# Patient Record
Sex: Female | Born: 1950 | Race: White | Hispanic: No | Smoking: Former smoker
Health system: Southern US, Community
[De-identification: ages and names within clinical notes are randomized; demographics above are authoritative.]

## PROBLEM LIST (undated history)

## (undated) DIAGNOSIS — H3320 Serous retinal detachment, unspecified eye: Secondary | ICD-10-CM

## (undated) DIAGNOSIS — D219 Benign neoplasm of connective and other soft tissue, unspecified: Secondary | ICD-10-CM

## (undated) DIAGNOSIS — W19XXXA Unspecified fall, initial encounter: Secondary | ICD-10-CM

## (undated) DIAGNOSIS — E079 Disorder of thyroid, unspecified: Secondary | ICD-10-CM

## (undated) DIAGNOSIS — I639 Cerebral infarction, unspecified: Secondary | ICD-10-CM

## (undated) DIAGNOSIS — I10 Essential (primary) hypertension: Secondary | ICD-10-CM

## (undated) DIAGNOSIS — H5440 Blindness, one eye, unspecified eye: Secondary | ICD-10-CM

## (undated) DIAGNOSIS — N289 Disorder of kidney and ureter, unspecified: Secondary | ICD-10-CM

## (undated) DIAGNOSIS — E7800 Pure hypercholesterolemia, unspecified: Secondary | ICD-10-CM

## (undated) HISTORY — DX: Benign neoplasm of connective and other soft tissue, unspecified: D21.9

## (undated) HISTORY — DX: Disorder of thyroid, unspecified: E07.9

## (undated) HISTORY — PX: EYE SURGERY: SHX253

## (undated) HISTORY — DX: Disorder of kidney and ureter, unspecified: N28.9

## (undated) HISTORY — DX: Essential (primary) hypertension: I10

## (undated) HISTORY — PX: FOOT SURGERY: SHX648

## (undated) HISTORY — PX: APPENDECTOMY: SHX54

## (undated) HISTORY — PX: CHOLECYSTECTOMY: SHX55

## (undated) HISTORY — DX: Unspecified fall, initial encounter: W19.XXXA

## (undated) HISTORY — DX: Serous retinal detachment, unspecified eye: H33.20

## (undated) HISTORY — DX: Cerebral infarction, unspecified: I63.9

## (undated) HISTORY — DX: Blindness, one eye, unspecified eye: H54.40

## (undated) HISTORY — DX: Pure hypercholesterolemia, unspecified: E78.00

## (undated) HISTORY — PX: BACK SURGERY: SHX140

## (undated) HISTORY — PX: OTHER SURGICAL HISTORY: SHX169

---

## 1997-07-20 ENCOUNTER — Encounter: Admission: RE | Admit: 1997-07-20 | Discharge: 1997-10-18 | Payer: Self-pay | Admitting: Internal Medicine

## 1998-01-28 ENCOUNTER — Encounter: Payer: Self-pay | Admitting: Emergency Medicine

## 1998-01-28 ENCOUNTER — Emergency Department (HOSPITAL_COMMUNITY): Admission: EM | Admit: 1998-01-28 | Discharge: 1998-01-28 | Payer: Self-pay | Admitting: Emergency Medicine

## 1999-03-27 ENCOUNTER — Ambulatory Visit (HOSPITAL_COMMUNITY): Admission: RE | Admit: 1999-03-27 | Discharge: 1999-03-27 | Payer: Self-pay | Admitting: Internal Medicine

## 1999-03-27 ENCOUNTER — Encounter: Payer: Self-pay | Admitting: Internal Medicine

## 1999-07-02 ENCOUNTER — Encounter: Admission: RE | Admit: 1999-07-02 | Discharge: 1999-09-30 | Payer: Self-pay | Admitting: *Deleted

## 1999-11-12 ENCOUNTER — Encounter: Payer: Self-pay | Admitting: Emergency Medicine

## 1999-11-12 ENCOUNTER — Inpatient Hospital Stay (HOSPITAL_COMMUNITY): Admission: EM | Admit: 1999-11-12 | Discharge: 1999-11-15 | Payer: Self-pay | Admitting: Emergency Medicine

## 1999-11-15 ENCOUNTER — Encounter: Payer: Self-pay | Admitting: Internal Medicine

## 1999-12-05 ENCOUNTER — Encounter: Admission: RE | Admit: 1999-12-05 | Discharge: 1999-12-31 | Payer: Self-pay | Admitting: Internal Medicine

## 1999-12-05 ENCOUNTER — Encounter: Admission: RE | Admit: 1999-12-05 | Discharge: 2000-03-04 | Payer: Self-pay | Admitting: *Deleted

## 2000-03-13 ENCOUNTER — Encounter: Admission: RE | Admit: 2000-03-13 | Discharge: 2000-06-11 | Payer: Self-pay | Admitting: *Deleted

## 2000-07-01 ENCOUNTER — Encounter: Admission: RE | Admit: 2000-07-01 | Discharge: 2000-09-29 | Payer: Self-pay | Admitting: *Deleted

## 2001-02-12 ENCOUNTER — Encounter: Payer: Self-pay | Admitting: Ophthalmology

## 2001-02-13 ENCOUNTER — Ambulatory Visit (HOSPITAL_COMMUNITY): Admission: RE | Admit: 2001-02-13 | Discharge: 2001-02-14 | Payer: Self-pay | Admitting: Ophthalmology

## 2001-08-06 ENCOUNTER — Other Ambulatory Visit: Admission: RE | Admit: 2001-08-06 | Discharge: 2001-08-06 | Payer: Self-pay | Admitting: Obstetrics and Gynecology

## 2002-08-11 ENCOUNTER — Other Ambulatory Visit: Admission: RE | Admit: 2002-08-11 | Discharge: 2002-08-11 | Payer: Self-pay | Admitting: Obstetrics and Gynecology

## 2003-01-05 ENCOUNTER — Encounter: Admission: RE | Admit: 2003-01-05 | Discharge: 2003-01-05 | Payer: Self-pay | Admitting: Obstetrics and Gynecology

## 2003-08-18 ENCOUNTER — Other Ambulatory Visit: Admission: RE | Admit: 2003-08-18 | Discharge: 2003-08-18 | Payer: Self-pay | Admitting: Obstetrics and Gynecology

## 2003-11-10 ENCOUNTER — Emergency Department (HOSPITAL_COMMUNITY): Admission: EM | Admit: 2003-11-10 | Discharge: 2003-11-10 | Payer: Self-pay | Admitting: Emergency Medicine

## 2004-01-13 ENCOUNTER — Ambulatory Visit: Payer: Self-pay | Admitting: Internal Medicine

## 2004-04-17 ENCOUNTER — Ambulatory Visit: Payer: Self-pay | Admitting: Internal Medicine

## 2004-06-06 ENCOUNTER — Ambulatory Visit: Payer: Self-pay | Admitting: Internal Medicine

## 2004-08-20 ENCOUNTER — Encounter: Admission: RE | Admit: 2004-08-20 | Discharge: 2004-08-20 | Payer: Self-pay | Admitting: Obstetrics and Gynecology

## 2004-09-19 ENCOUNTER — Other Ambulatory Visit: Admission: RE | Admit: 2004-09-19 | Discharge: 2004-09-19 | Payer: Self-pay | Admitting: Obstetrics and Gynecology

## 2004-10-25 ENCOUNTER — Ambulatory Visit: Payer: Self-pay | Admitting: Internal Medicine

## 2004-12-11 ENCOUNTER — Ambulatory Visit: Payer: Self-pay | Admitting: Internal Medicine

## 2005-06-04 ENCOUNTER — Ambulatory Visit: Payer: Self-pay | Admitting: Internal Medicine

## 2005-06-19 ENCOUNTER — Inpatient Hospital Stay (HOSPITAL_COMMUNITY): Admission: AD | Admit: 2005-06-19 | Discharge: 2005-06-21 | Payer: Self-pay | Admitting: Orthopedic Surgery

## 2005-06-22 ENCOUNTER — Observation Stay (HOSPITAL_COMMUNITY): Admission: EM | Admit: 2005-06-22 | Discharge: 2005-06-24 | Payer: Self-pay | Admitting: Emergency Medicine

## 2005-09-11 ENCOUNTER — Encounter: Payer: Self-pay | Admitting: Internal Medicine

## 2005-10-01 ENCOUNTER — Encounter: Admission: RE | Admit: 2005-10-01 | Discharge: 2005-10-01 | Payer: Self-pay | Admitting: Obstetrics and Gynecology

## 2005-10-10 ENCOUNTER — Other Ambulatory Visit: Admission: RE | Admit: 2005-10-10 | Discharge: 2005-10-10 | Payer: Self-pay | Admitting: Obstetrics and Gynecology

## 2005-10-10 ENCOUNTER — Ambulatory Visit: Payer: Self-pay | Admitting: Internal Medicine

## 2005-11-15 ENCOUNTER — Ambulatory Visit: Payer: Self-pay | Admitting: Internal Medicine

## 2005-11-28 ENCOUNTER — Ambulatory Visit: Payer: Self-pay | Admitting: Internal Medicine

## 2005-12-05 ENCOUNTER — Ambulatory Visit: Payer: Self-pay | Admitting: Gastroenterology

## 2006-01-08 ENCOUNTER — Ambulatory Visit: Payer: Self-pay | Admitting: Gastroenterology

## 2006-09-29 ENCOUNTER — Encounter: Payer: Self-pay | Admitting: Internal Medicine

## 2006-09-29 DIAGNOSIS — I2510 Atherosclerotic heart disease of native coronary artery without angina pectoris: Secondary | ICD-10-CM | POA: Insufficient documentation

## 2006-09-29 DIAGNOSIS — E119 Type 2 diabetes mellitus without complications: Secondary | ICD-10-CM

## 2006-09-29 DIAGNOSIS — E785 Hyperlipidemia, unspecified: Secondary | ICD-10-CM

## 2006-09-29 DIAGNOSIS — Z8679 Personal history of other diseases of the circulatory system: Secondary | ICD-10-CM | POA: Insufficient documentation

## 2006-09-29 DIAGNOSIS — I10 Essential (primary) hypertension: Secondary | ICD-10-CM | POA: Insufficient documentation

## 2006-09-29 DIAGNOSIS — F329 Major depressive disorder, single episode, unspecified: Secondary | ICD-10-CM

## 2006-11-12 ENCOUNTER — Ambulatory Visit: Payer: Self-pay | Admitting: Internal Medicine

## 2006-11-13 ENCOUNTER — Encounter: Admission: RE | Admit: 2006-11-13 | Discharge: 2006-11-13 | Payer: Self-pay | Admitting: Internal Medicine

## 2006-11-15 ENCOUNTER — Ambulatory Visit: Payer: Self-pay | Admitting: Internal Medicine

## 2006-12-25 ENCOUNTER — Encounter: Payer: Self-pay | Admitting: Internal Medicine

## 2007-02-11 ENCOUNTER — Encounter: Payer: Self-pay | Admitting: Internal Medicine

## 2007-02-18 ENCOUNTER — Telehealth: Payer: Self-pay | Admitting: Internal Medicine

## 2007-02-27 ENCOUNTER — Telehealth: Payer: Self-pay | Admitting: Internal Medicine

## 2007-03-20 ENCOUNTER — Telehealth: Payer: Self-pay | Admitting: Internal Medicine

## 2007-04-16 ENCOUNTER — Telehealth: Payer: Self-pay | Admitting: Internal Medicine

## 2007-04-17 ENCOUNTER — Ambulatory Visit: Payer: Self-pay | Admitting: Internal Medicine

## 2007-04-17 LAB — CONVERTED CEMR LAB
Crystals: NEGATIVE
Hemoglobin, Urine: NEGATIVE
Ketones, ur: NEGATIVE mg/dL
Leukocytes, UA: NEGATIVE
Nitrite: NEGATIVE
Specific Gravity, Urine: >=1.03 (ref 1.000–1.03)
Total Protein, Urine: 30 mg/dL — AB
WBC, UA: NONE SEEN cells/hpf
pH: 5.5 (ref 5.0–8.0)

## 2007-04-24 ENCOUNTER — Encounter: Payer: Self-pay | Admitting: Internal Medicine

## 2007-04-29 ENCOUNTER — Ambulatory Visit: Payer: Self-pay | Admitting: Internal Medicine

## 2007-04-29 DIAGNOSIS — D239 Other benign neoplasm of skin, unspecified: Secondary | ICD-10-CM | POA: Insufficient documentation

## 2007-04-30 ENCOUNTER — Encounter: Payer: Self-pay | Admitting: Internal Medicine

## 2007-05-02 ENCOUNTER — Encounter: Payer: Self-pay | Admitting: Internal Medicine

## 2007-05-04 ENCOUNTER — Encounter: Payer: Self-pay | Admitting: Internal Medicine

## 2007-05-11 ENCOUNTER — Telehealth: Payer: Self-pay | Admitting: Internal Medicine

## 2007-05-15 ENCOUNTER — Encounter: Payer: Self-pay | Admitting: Internal Medicine

## 2007-05-19 ENCOUNTER — Telehealth: Payer: Self-pay | Admitting: Internal Medicine

## 2007-07-31 ENCOUNTER — Ambulatory Visit: Payer: Self-pay | Admitting: Internal Medicine

## 2007-07-31 LAB — CONVERTED CEMR LAB
BUN: 12 mg/dL (ref 6–23)
CO2: 32 meq/L (ref 19–32)
Calcium: 9.6 mg/dL (ref 8.4–10.5)
Chloride: 105 meq/L (ref 96–112)
Cholesterol: 152 mg/dL (ref 0–200)
Creatinine, Ser: 1 mg/dL (ref 0.4–1.2)
GFR calc Af Amer: 73 mL/min
GFR calc non Af Amer: 60 mL/min
Glucose, Bld: 100 mg/dL — ABNORMAL HIGH (ref 70–99)
HDL: 44.4 mg/dL (ref >39.0)
Hgb A1c MFr Bld: 7.4 % — ABNORMAL HIGH (ref 4.6–6.0)
LDL Cholesterol: 91 mg/dL (ref 0–99)
Potassium: 4.3 meq/L (ref 3.5–5.1)
Sodium: 144 meq/L (ref 135–145)
Total CHOL/HDL Ratio: 3.4
Triglycerides: 82 mg/dL (ref 0–149)
VLDL: 16 mg/dL (ref 0–40)

## 2007-08-04 ENCOUNTER — Encounter: Payer: Self-pay | Admitting: Internal Medicine

## 2007-08-05 ENCOUNTER — Encounter: Payer: Self-pay | Admitting: Internal Medicine

## 2007-08-21 ENCOUNTER — Encounter: Payer: Self-pay | Admitting: Internal Medicine

## 2007-08-27 ENCOUNTER — Encounter: Payer: Self-pay | Admitting: Internal Medicine

## 2007-09-10 ENCOUNTER — Encounter: Payer: Self-pay | Admitting: Internal Medicine

## 2007-10-15 ENCOUNTER — Telehealth: Payer: Self-pay | Admitting: Internal Medicine

## 2007-12-03 ENCOUNTER — Encounter: Admission: RE | Admit: 2007-12-03 | Discharge: 2007-12-03 | Payer: Self-pay | Admitting: Internal Medicine

## 2007-12-03 ENCOUNTER — Ambulatory Visit: Payer: Self-pay | Admitting: Internal Medicine

## 2007-12-07 ENCOUNTER — Encounter: Payer: Self-pay | Admitting: Obstetrics and Gynecology

## 2007-12-07 ENCOUNTER — Other Ambulatory Visit: Admission: RE | Admit: 2007-12-07 | Discharge: 2007-12-07 | Payer: Self-pay | Admitting: Obstetrics and Gynecology

## 2007-12-07 ENCOUNTER — Ambulatory Visit: Payer: Self-pay | Admitting: Obstetrics and Gynecology

## 2008-02-25 ENCOUNTER — Telehealth: Payer: Self-pay | Admitting: Internal Medicine

## 2008-04-22 ENCOUNTER — Telehealth: Payer: Self-pay | Admitting: Internal Medicine

## 2008-04-27 ENCOUNTER — Ambulatory Visit: Payer: Self-pay | Admitting: Internal Medicine

## 2008-04-27 DIAGNOSIS — M25519 Pain in unspecified shoulder: Secondary | ICD-10-CM

## 2008-04-27 LAB — CONVERTED CEMR LAB
ALT: 20 units/L (ref 0–35)
AST: 23 units/L (ref 0–37)
Albumin: 3.6 g/dL (ref 3.5–5.2)
Alkaline Phosphatase: 70 units/L (ref 39–117)
Basophils Absolute: 0.1 10*3/uL (ref 0.0–0.1)
Calcium: 10.3 mg/dL (ref 8.4–10.5)
Creatinine, Ser: 1 mg/dL (ref 0.4–1.2)
Creatinine,U: 189.6 mg/dL
Eosinophils Absolute: 0.1 10*3/uL (ref 0.0–0.7)
Eosinophils Relative: 2 % (ref 0.0–5.0)
Glucose, Bld: 144 mg/dL — ABNORMAL HIGH (ref 70–99)
HDL: 46 mg/dL (ref >39.00)
Hemoglobin: 14.6 g/dL (ref 12.0–15.0)
LDL Cholesterol: 95 mg/dL (ref 0–99)
Leukocytes, UA: NEGATIVE
MCHC: 35.3 g/dL (ref 30.0–36.0)
MCV: 90.6 fL (ref 78.0–100.0)
Microalb, Ur: 4.5 mg/dL — ABNORMAL HIGH (ref 0.0–1.9)
Neutrophils Relative %: 76.9 % (ref 43.0–77.0)
Platelets: 190 10*3/uL (ref 150.0–400.0)
RBC: 4.57 M/uL (ref 3.87–5.11)
RDW: 12.7 % (ref 11.5–14.6)
TSH: 2.76 microintl units/mL (ref 0.35–5.50)
Total CHOL/HDL Ratio: 4
Triglycerides: 108 mg/dL (ref 0.0–149.0)
Urobilinogen, UA: 0.2 (ref 0.0–1.0)
WBC: 7.5 10*3/uL (ref 4.5–10.5)
pH: 5.5 (ref 5.0–8.0)

## 2008-04-28 ENCOUNTER — Encounter: Payer: Self-pay | Admitting: Internal Medicine

## 2008-04-28 ENCOUNTER — Telehealth: Payer: Self-pay | Admitting: Internal Medicine

## 2008-05-05 ENCOUNTER — Telehealth: Payer: Self-pay | Admitting: Internal Medicine

## 2008-05-11 ENCOUNTER — Ambulatory Visit: Payer: Self-pay | Admitting: Internal Medicine

## 2008-05-23 ENCOUNTER — Encounter: Payer: Self-pay | Admitting: Internal Medicine

## 2008-06-24 ENCOUNTER — Telehealth: Payer: Self-pay | Admitting: Internal Medicine

## 2008-07-06 ENCOUNTER — Encounter: Payer: Self-pay | Admitting: Internal Medicine

## 2008-07-15 ENCOUNTER — Encounter: Payer: Self-pay | Admitting: Internal Medicine

## 2008-07-28 ENCOUNTER — Ambulatory Visit: Payer: Self-pay | Admitting: Internal Medicine

## 2008-07-28 DIAGNOSIS — H3320 Serous retinal detachment, unspecified eye: Secondary | ICD-10-CM | POA: Insufficient documentation

## 2008-08-03 ENCOUNTER — Telehealth: Payer: Self-pay | Admitting: Internal Medicine

## 2008-08-29 ENCOUNTER — Telehealth: Payer: Self-pay | Admitting: Internal Medicine

## 2008-09-16 ENCOUNTER — Encounter: Payer: Self-pay | Admitting: Internal Medicine

## 2008-10-18 ENCOUNTER — Telehealth (INDEPENDENT_AMBULATORY_CARE_PROVIDER_SITE_OTHER): Payer: Self-pay | Admitting: *Deleted

## 2008-10-21 ENCOUNTER — Ambulatory Visit: Payer: Self-pay | Admitting: Internal Medicine

## 2008-10-21 ENCOUNTER — Telehealth (INDEPENDENT_AMBULATORY_CARE_PROVIDER_SITE_OTHER): Payer: Self-pay | Admitting: *Deleted

## 2008-10-21 DIAGNOSIS — R112 Nausea with vomiting, unspecified: Secondary | ICD-10-CM | POA: Insufficient documentation

## 2008-10-21 LAB — CONVERTED CEMR LAB
AST: 25 units/L (ref 0–37)
Basophils Relative: 0.4 % (ref 0.0–3.0)
CO2: 28 meq/L (ref 19–32)
Chloride: 103 meq/L (ref 96–112)
Creatinine, Ser: 4.3 mg/dL — ABNORMAL HIGH (ref 0.4–1.2)
GFR calc non Af Amer: 11.14 mL/min (ref >60)
Glucose, Bld: 219 mg/dL — ABNORMAL HIGH (ref 70–99)
Hemoglobin, Urine: NEGATIVE
Hemoglobin: 15 g/dL (ref 12.0–15.0)
Ketones, ur: NEGATIVE mg/dL
Lymphocytes Relative: 20.7 % (ref 12.0–46.0)
Lymphs Abs: 1.8 10*3/uL (ref 0.7–4.0)
MCHC: 34.2 g/dL (ref 30.0–36.0)
MCV: 94.4 fL (ref 78.0–100.0)
Monocytes Absolute: 0.4 10*3/uL (ref 0.1–1.0)
Neutrophils Relative %: 72.8 % (ref 43.0–77.0)
Nitrite: NEGATIVE
Potassium: 4.5 meq/L (ref 3.5–5.1)
RBC: 4.63 M/uL (ref 3.87–5.11)
RDW: 12.2 % (ref 11.5–14.6)
Sodium: 140 meq/L (ref 135–145)
Total Protein: 7.4 g/dL (ref 6.0–8.3)
Urobilinogen, UA: 0.2 (ref 0.0–1.0)
pH: 5 (ref 5.0–8.0)

## 2008-10-24 ENCOUNTER — Ambulatory Visit: Payer: Self-pay | Admitting: Internal Medicine

## 2008-10-24 ENCOUNTER — Inpatient Hospital Stay (HOSPITAL_COMMUNITY): Admission: EM | Admit: 2008-10-24 | Discharge: 2008-10-26 | Payer: Self-pay | Admitting: Emergency Medicine

## 2008-10-26 ENCOUNTER — Telehealth: Payer: Self-pay | Admitting: Internal Medicine

## 2008-10-29 ENCOUNTER — Telehealth (INDEPENDENT_AMBULATORY_CARE_PROVIDER_SITE_OTHER): Payer: Self-pay | Admitting: *Deleted

## 2008-11-01 ENCOUNTER — Telehealth: Payer: Self-pay | Admitting: Internal Medicine

## 2008-11-09 ENCOUNTER — Ambulatory Visit: Payer: Self-pay | Admitting: Internal Medicine

## 2008-11-30 ENCOUNTER — Encounter: Payer: Self-pay | Admitting: Internal Medicine

## 2008-12-01 ENCOUNTER — Encounter: Payer: Self-pay | Admitting: Internal Medicine

## 2008-12-07 ENCOUNTER — Ambulatory Visit: Payer: Self-pay | Admitting: Obstetrics and Gynecology

## 2008-12-07 ENCOUNTER — Encounter: Payer: Self-pay | Admitting: Obstetrics and Gynecology

## 2008-12-07 ENCOUNTER — Other Ambulatory Visit: Admission: RE | Admit: 2008-12-07 | Discharge: 2008-12-07 | Payer: Self-pay | Admitting: Obstetrics and Gynecology

## 2008-12-12 ENCOUNTER — Ambulatory Visit: Payer: Self-pay | Admitting: Obstetrics and Gynecology

## 2009-01-12 ENCOUNTER — Ambulatory Visit: Payer: Self-pay | Admitting: Internal Medicine

## 2009-01-12 DIAGNOSIS — R630 Anorexia: Secondary | ICD-10-CM

## 2009-01-12 DIAGNOSIS — R279 Unspecified lack of coordination: Secondary | ICD-10-CM

## 2009-01-12 DIAGNOSIS — I951 Orthostatic hypotension: Secondary | ICD-10-CM

## 2009-01-13 ENCOUNTER — Telehealth: Payer: Self-pay | Admitting: Internal Medicine

## 2009-01-16 ENCOUNTER — Ambulatory Visit (HOSPITAL_COMMUNITY): Admission: RE | Admit: 2009-01-16 | Discharge: 2009-01-16 | Payer: Self-pay | Admitting: Internal Medicine

## 2009-01-19 ENCOUNTER — Telehealth: Payer: Self-pay | Admitting: Internal Medicine

## 2009-01-20 ENCOUNTER — Encounter: Admission: RE | Admit: 2009-01-20 | Discharge: 2009-02-08 | Payer: Self-pay | Admitting: Internal Medicine

## 2009-01-30 ENCOUNTER — Telehealth: Payer: Self-pay | Admitting: Internal Medicine

## 2009-02-10 ENCOUNTER — Encounter: Payer: Self-pay | Admitting: Internal Medicine

## 2009-02-22 ENCOUNTER — Encounter: Admission: RE | Admit: 2009-02-22 | Discharge: 2009-03-21 | Payer: Self-pay | Admitting: Internal Medicine

## 2009-02-27 ENCOUNTER — Ambulatory Visit: Payer: Self-pay | Admitting: Internal Medicine

## 2009-03-02 ENCOUNTER — Telehealth: Payer: Self-pay | Admitting: Internal Medicine

## 2009-03-06 ENCOUNTER — Telehealth: Payer: Self-pay | Admitting: Internal Medicine

## 2009-03-09 ENCOUNTER — Telehealth: Payer: Self-pay | Admitting: Internal Medicine

## 2009-03-16 ENCOUNTER — Telehealth: Payer: Self-pay | Admitting: Internal Medicine

## 2009-03-16 ENCOUNTER — Encounter: Payer: Self-pay | Admitting: Internal Medicine

## 2009-04-10 ENCOUNTER — Telehealth: Payer: Self-pay | Admitting: Internal Medicine

## 2009-04-12 ENCOUNTER — Inpatient Hospital Stay (HOSPITAL_COMMUNITY): Admission: EM | Admit: 2009-04-12 | Discharge: 2009-04-15 | Payer: Self-pay | Admitting: Emergency Medicine

## 2009-04-12 ENCOUNTER — Encounter (INDEPENDENT_AMBULATORY_CARE_PROVIDER_SITE_OTHER): Payer: Self-pay | Admitting: *Deleted

## 2009-04-12 ENCOUNTER — Ambulatory Visit: Payer: Self-pay | Admitting: Internal Medicine

## 2009-04-12 DIAGNOSIS — N184 Chronic kidney disease, stage 4 (severe): Secondary | ICD-10-CM

## 2009-04-12 LAB — CONVERTED CEMR LAB
Basophils Absolute: 0 10*3/uL (ref 0.0–0.1)
Basophils Relative: 0.4 % (ref 0.0–3.0)
CO2: 34 meq/L — ABNORMAL HIGH (ref 19–32)
Calcium: 10.3 mg/dL (ref 8.4–10.5)
Chloride: 101 meq/L (ref 96–112)
Creatinine, Ser: 3.9 mg/dL — ABNORMAL HIGH (ref 0.4–1.2)
Eosinophils Relative: 1.7 % (ref 0.0–5.0)
GFR calc non Af Amer: 12.45 mL/min (ref >60)
HCT: 35.9 % — ABNORMAL LOW (ref 36.0–46.0)
Hemoglobin: 12.1 g/dL (ref 12.0–15.0)
Hgb A1c MFr Bld: 5.4 % (ref 4.6–6.5)
Monocytes Absolute: 0.5 10*3/uL (ref 0.1–1.0)
Neutro Abs: 6.8 10*3/uL (ref 1.4–7.7)
Potassium: 3.7 meq/L (ref 3.5–5.1)
WBC: 9.2 10*3/uL (ref 4.5–10.5)

## 2009-04-24 ENCOUNTER — Telehealth: Payer: Self-pay | Admitting: Internal Medicine

## 2009-04-27 ENCOUNTER — Ambulatory Visit: Payer: Self-pay | Admitting: Internal Medicine

## 2009-04-27 LAB — CONVERTED CEMR LAB
BUN: 23 mg/dL (ref 6–23)
Chloride: 103 meq/L (ref 96–112)
Creatinine, Ser: 4.6 mg/dL (ref 0.4–1.2)
Glucose, Bld: 102 mg/dL — ABNORMAL HIGH (ref 70–99)
Potassium: 3.9 meq/L (ref 3.5–5.1)
Sodium: 143 meq/L (ref 135–145)

## 2009-04-28 ENCOUNTER — Telehealth: Payer: Self-pay | Admitting: Internal Medicine

## 2009-04-28 ENCOUNTER — Encounter: Payer: Self-pay | Admitting: Internal Medicine

## 2009-05-02 ENCOUNTER — Encounter: Payer: Self-pay | Admitting: Internal Medicine

## 2009-05-02 LAB — CONVERTED CEMR LAB: Collection Interval-CRCL: 24 hr

## 2009-05-15 ENCOUNTER — Telehealth: Payer: Self-pay | Admitting: Internal Medicine

## 2009-05-16 ENCOUNTER — Telehealth: Payer: Self-pay | Admitting: Internal Medicine

## 2009-05-16 ENCOUNTER — Encounter: Payer: Self-pay | Admitting: Internal Medicine

## 2009-05-31 ENCOUNTER — Telehealth: Payer: Self-pay | Admitting: Internal Medicine

## 2009-06-08 ENCOUNTER — Telehealth: Payer: Self-pay | Admitting: Internal Medicine

## 2009-06-09 ENCOUNTER — Ambulatory Visit: Payer: Self-pay | Admitting: Internal Medicine

## 2009-06-25 ENCOUNTER — Encounter: Payer: Self-pay | Admitting: Internal Medicine

## 2009-07-12 ENCOUNTER — Encounter: Payer: Self-pay | Admitting: Internal Medicine

## 2009-07-18 ENCOUNTER — Encounter: Payer: Self-pay | Admitting: Internal Medicine

## 2009-08-03 ENCOUNTER — Telehealth: Payer: Self-pay | Admitting: Internal Medicine

## 2009-08-24 ENCOUNTER — Ambulatory Visit: Payer: Self-pay | Admitting: Internal Medicine

## 2009-09-04 ENCOUNTER — Telehealth: Payer: Self-pay | Admitting: Internal Medicine

## 2009-09-07 ENCOUNTER — Inpatient Hospital Stay (HOSPITAL_COMMUNITY): Admission: AD | Admit: 2009-09-07 | Discharge: 2009-09-12 | Payer: Self-pay | Admitting: Internal Medicine

## 2009-09-08 ENCOUNTER — Ambulatory Visit: Payer: Self-pay | Admitting: Internal Medicine

## 2009-09-11 ENCOUNTER — Ambulatory Visit: Payer: Self-pay | Admitting: Physical Medicine & Rehabilitation

## 2009-09-12 ENCOUNTER — Inpatient Hospital Stay (HOSPITAL_COMMUNITY)
Admission: RE | Admit: 2009-09-12 | Discharge: 2009-09-20 | Payer: Self-pay | Admitting: Physical Medicine & Rehabilitation

## 2009-09-15 ENCOUNTER — Ambulatory Visit: Payer: Self-pay | Admitting: Psychology

## 2009-09-16 ENCOUNTER — Ambulatory Visit: Payer: Self-pay | Admitting: Physical Medicine & Rehabilitation

## 2009-09-20 ENCOUNTER — Encounter: Payer: Self-pay | Admitting: Internal Medicine

## 2009-09-23 ENCOUNTER — Encounter: Payer: Self-pay | Admitting: Internal Medicine

## 2009-09-25 ENCOUNTER — Telehealth: Payer: Self-pay | Admitting: Internal Medicine

## 2009-09-29 ENCOUNTER — Ambulatory Visit: Payer: Self-pay | Admitting: Internal Medicine

## 2009-09-29 ENCOUNTER — Ambulatory Visit: Payer: Self-pay | Admitting: Cardiology

## 2009-09-29 ENCOUNTER — Inpatient Hospital Stay (HOSPITAL_COMMUNITY): Admission: EM | Admit: 2009-09-29 | Discharge: 2009-10-06 | Payer: Self-pay | Admitting: Emergency Medicine

## 2009-10-04 ENCOUNTER — Telehealth: Payer: Self-pay | Admitting: Internal Medicine

## 2009-10-04 ENCOUNTER — Encounter (INDEPENDENT_AMBULATORY_CARE_PROVIDER_SITE_OTHER): Payer: Self-pay | Admitting: Neurology

## 2009-10-05 ENCOUNTER — Ambulatory Visit: Payer: Self-pay | Admitting: Vascular Surgery

## 2009-10-05 ENCOUNTER — Encounter: Payer: Self-pay | Admitting: Internal Medicine

## 2009-10-05 ENCOUNTER — Encounter (INDEPENDENT_AMBULATORY_CARE_PROVIDER_SITE_OTHER): Payer: Self-pay | Admitting: Neurology

## 2009-10-06 ENCOUNTER — Telehealth: Payer: Self-pay | Admitting: Internal Medicine

## 2009-10-09 ENCOUNTER — Telehealth: Payer: Self-pay | Admitting: Internal Medicine

## 2009-10-11 ENCOUNTER — Telehealth: Payer: Self-pay | Admitting: Internal Medicine

## 2009-10-12 ENCOUNTER — Ambulatory Visit: Payer: Self-pay | Admitting: Internal Medicine

## 2009-10-19 ENCOUNTER — Telehealth: Payer: Self-pay | Admitting: Internal Medicine

## 2009-10-20 ENCOUNTER — Telehealth: Payer: Self-pay | Admitting: Internal Medicine

## 2009-10-23 ENCOUNTER — Telehealth: Payer: Self-pay | Admitting: Internal Medicine

## 2009-10-24 ENCOUNTER — Telehealth: Payer: Self-pay | Admitting: Internal Medicine

## 2009-10-25 ENCOUNTER — Telehealth (INDEPENDENT_AMBULATORY_CARE_PROVIDER_SITE_OTHER): Payer: Self-pay | Admitting: *Deleted

## 2009-10-30 ENCOUNTER — Ambulatory Visit: Payer: Self-pay | Admitting: Internal Medicine

## 2009-10-30 DIAGNOSIS — E538 Deficiency of other specified B group vitamins: Secondary | ICD-10-CM | POA: Insufficient documentation

## 2009-11-03 ENCOUNTER — Telehealth: Payer: Self-pay | Admitting: Internal Medicine

## 2009-11-08 ENCOUNTER — Telehealth: Payer: Self-pay | Admitting: Internal Medicine

## 2009-11-17 ENCOUNTER — Telehealth: Payer: Self-pay | Admitting: Internal Medicine

## 2009-11-20 ENCOUNTER — Encounter: Payer: Self-pay | Admitting: Internal Medicine

## 2009-11-22 ENCOUNTER — Encounter: Payer: Self-pay | Admitting: Internal Medicine

## 2009-11-30 ENCOUNTER — Ambulatory Visit: Payer: Self-pay | Admitting: Internal Medicine

## 2009-12-09 ENCOUNTER — Ambulatory Visit: Payer: Self-pay | Admitting: Internal Medicine

## 2009-12-11 ENCOUNTER — Ambulatory Visit: Payer: Self-pay | Admitting: Obstetrics and Gynecology

## 2009-12-18 ENCOUNTER — Encounter: Payer: Self-pay | Admitting: Internal Medicine

## 2010-01-02 ENCOUNTER — Ambulatory Visit: Payer: Self-pay | Admitting: Internal Medicine

## 2010-01-25 ENCOUNTER — Ambulatory Visit: Payer: Self-pay | Admitting: Internal Medicine

## 2010-01-25 ENCOUNTER — Ambulatory Visit: Payer: Self-pay | Admitting: Obstetrics and Gynecology

## 2010-02-02 ENCOUNTER — Ambulatory Visit: Payer: Self-pay | Admitting: Internal Medicine

## 2010-03-04 ENCOUNTER — Encounter: Payer: Self-pay | Admitting: Internal Medicine

## 2010-03-04 ENCOUNTER — Encounter: Payer: Self-pay | Admitting: Obstetrics and Gynecology

## 2010-03-05 ENCOUNTER — Telehealth: Payer: Self-pay | Admitting: Internal Medicine

## 2010-03-05 ENCOUNTER — Encounter: Payer: Self-pay | Admitting: Internal Medicine

## 2010-03-05 ENCOUNTER — Ambulatory Visit (INDEPENDENT_AMBULATORY_CARE_PROVIDER_SITE_OTHER)
Admission: RE | Admit: 2010-03-05 | Discharge: 2010-03-05 | Payer: Medicare Other | Source: Home / Self Care | Attending: Internal Medicine | Admitting: Internal Medicine

## 2010-03-05 DIAGNOSIS — E538 Deficiency of other specified B group vitamins: Secondary | ICD-10-CM

## 2010-03-12 ENCOUNTER — Telehealth: Payer: Self-pay | Admitting: Internal Medicine

## 2010-03-13 NOTE — Progress Notes (Signed)
Summary: CALL  Phone Note Call from Patient   Summary of Call: See previous phone note, pt was unable to complete study. Her GI dr is going to "give her medication" to help keep her from vomiting. She is req a call back from Dr Debby Bud. Would you like to see pt in office for f/u?  Initial call taken by: Lamar Sprinkles, CMA,  May 16, 2009 12:28 PM  Follow-up for Phone Call        tried calling - no anser. Did not leave msg. called-spoke with SO. She was stated on reglan  by Dr. Kathrene Bongo. Follow-up by: Jacques Navy MD,  May 16, 2009 1:13 PM

## 2010-03-13 NOTE — Progress Notes (Signed)
Summary: INSULIN  Phone Note Call from Patient   Summary of Call: Pt was d/c'd 8/10. Cbg's are running 133 to 175. Please advise. She was changed from 50 units to 30 units daily. Should he change dose?  Initial call taken by: Lamar Sprinkles, CMA,  September 25, 2009 2:57 PM  Follow-up for Phone Call        we can slowly titrate up to avoid low blood sugars: increase to 35 units, if fasting CBG is greater than 140 3 days in a row increase by 5 units, then repeat the 3 day cycle. If fasting CBG less than 100 let me know. Follow-up by: Jacques Navy MD,  September 26, 2009 8:22 AM  Additional Follow-up for Phone Call Additional follow up Details #1::        Pt's husband informed.  Additional Follow-up by: Lamar Sprinkles, CMA,  September 26, 2009 5:01 PM

## 2010-03-13 NOTE — Progress Notes (Signed)
Summary: BP UPDATE  Phone Note From Other Clinic Call back at Home Phone [phone]   Caller: [phone] [facility]  Summary of Call: Home health called for pt. FYI BP sitting 124/64 standing 110/60 positive for lightheadedness & "feeling like she was going to pass out" Initial call taken by: Lamar Sprinkles, CMA,  October 19, 2009 3:39 PM  Follow-up for Phone Call        Much better than in the past. will continue present dose of fludrocortisone. Follow-up by: Jacques Navy MD,  October 19, 2009 5:40 PM  Additional Follow-up for Phone Call Additional follow up Details #1::        Left detailed vm on pt's home # Additional Follow-up by: Lamar Sprinkles, CMA,  October 19, 2009 6:25 PM

## 2010-03-13 NOTE — Progress Notes (Signed)
  Phone Note Refill Request Message from:  Fax from Pharmacy on April 10, 2009 9:35 AM  Refills Requested: Medication #1:  SIMVASTATIN 20 MG  TABS Take 1 tablet by mouth once a day Initial call taken by: Ami Bullins CMA,  April 10, 2009 9:35 AM    Prescriptions: SIMVASTATIN 20 MG  TABS (SIMVASTATIN) Take 1 tablet by mouth once a day  #90 x 3   Entered by:   Ami Bullins CMA   Authorized by:   Jacques Navy MD   Signed by:   Bill Salinas CMA on 04/10/2009   Method used:   Faxed to ...       [facility]       [address] [place], [place]  [address]       Ph: [phone]       Fax: [phone]   RxID:   [id number]

## 2010-03-13 NOTE — Medication Information (Signed)
Summary: [facility]  [facility]   Imported By: Sherian Rein 06/28/2009 10:48:34  _____________________________________________________________________  External Attachment:    Type:   Image     Comment:   External Document

## 2010-03-13 NOTE — Progress Notes (Signed)
Summary: PT SERVICES  Phone Note Other Incoming   CallerDenny [phone] Summary of Call: Physic. Ther calling to obtain a verbal order for to extended pt's ther. 3 times a week for 4 more weeks.  Initial call taken by: Ami Bullins CMA,  November 03, 2009 1:51 PM  Follow-up for Phone Call        OK to extend physical therapy as requested.  Follow-up by: Jacques Navy MD,  November 06, 2009 8:35 AM  Additional Follow-up for Phone Call Additional follow up Details #1::        Left vm for ERIN  Additional Follow-up by: Lamar Sprinkles, CMA,  November 06, 2009 11:34 AM

## 2010-03-13 NOTE — Letter (Signed)
Summary: [facility]   Imported By: Sherian Rein 12/02/2009 10:07:24  _____________________________________________________________________  External Attachment:    Type:   Image     Comment:   External Document

## 2010-03-13 NOTE — Miscellaneous (Signed)
Summary: inpatient [name]  inpatient [name]   Imported By: Lester [place] 09/29/2009 10:26:56  _____________________________________________________________________  External Attachment:    Type:   Image     Comment:   External Document

## 2010-03-13 NOTE — Progress Notes (Signed)
Summary: REFILL - Tramadol  Phone Note From Other Clinic   Summary of Call: [facility] that was signed by Dr. It was for a refill of tramadol. This med is not in EMR. Please provide details of rx. Request was for 50mg . Need directions and qty for 3 mth supply. Thanks Initial call taken by: Lamar Sprinkles, CMA,  March 06, 2009 4:11 PM  Follow-up for Phone Call        tramadol 50mg  1 or 2 three times a day for pain (replacing darvocet); #180/mon refill x 3 Follow-up by: Jacques Navy MD,  March 06, 2009 10:36 PM  Additional Follow-up for Phone Call Additional follow up Details #1::        Mail order is req 3 mth supply, Is #540 with 0 refills ok?  Additional Follow-up by: Lamar Sprinkles, CMA,  March 07, 2009 9:39 AM    Additional Follow-up for Phone Call Additional follow up Details #2::    OK for 90 day supply Follow-up by: Jacques Navy MD,  March 07, 2009 9:55 AM  New/Updated Medications: TRAMADOL HCL 50 MG TABS (TRAMADOL HCL) 1 or 2 three times a day as needed for pain Prescriptions: TRAMADOL HCL 50 MG TABS (TRAMADOL HCL) 1 or 2 three times a day as needed for pain  #540 x 0   Entered by:   Lamar Sprinkles, CMA   Authorized by:   Jacques Navy MD   Signed by:   Lamar Sprinkles, CMA on 03/07/2009   Method used:   Faxed to ...       [facility]       [address] [place], [place]  [address]       Ph: [phone]       Fax: [phone]   RxID:   [id number]

## 2010-03-13 NOTE — Assessment & Plan Note (Signed)
Summary: b12 shot/will bring own/men/cd   Nurse Visit   Allergies: No Known Drug Allergies  Medication Administration  Injection # 1:    Medication: Vit B12 1000 mcg    Diagnosis: B12 DEFICIENCY (ICD-266.2)    Route: IM    Site: L deltoid    Exp Date: 09/2010    Lot #: [id number]    Mfr: American Regent    Patient tolerated injection without complications    Given by: Brenton Grills MA (October 30, 2009 3:13 PM)  Orders Added: 1)  Admin of patients own med IM/SQ [phone]

## 2010-03-13 NOTE — Progress Notes (Signed)
  Phone Note Refill Request      New/Updated Medications: LANTUS 100 UNIT/ML  SOLN (INSULIN GLARGINE) 30 Units every A.M. FUROSEMIDE 40 MG TABS (FUROSEMIDE) 1 tab once daily POTASSIUM CHLORIDE 20 MEQ PACK (POTASSIUM CHLORIDE) 1 once daily SERTRALINE HCL 100 MG TABS (SERTRALINE HCL) 1 tab once daily AMLODIPINE BESYLATE 2.5 MG TABS (AMLODIPINE BESYLATE) 1 tab once daily ACETAMINOPHEN 325 MG TABS (ACETAMINOPHEN) 2 tabs q 4 hours as needed BISCOLAX 10 MG SUPP (BISACODYL) 1 rectaly as needed      Current Medications (verified): 1)  Carvedilol 25 Mg  Tabs (Carvedilol) .... 1/2 By Mouth Two Times A Day 2)  Lantus 100 Unit/ml  Soln (Insulin Glargine) .... 30 Units Every A.m. 3)  Simvastatin 20 Mg  Tabs (Simvastatin) .... Take 1 Tablet By Mouth Once A Day 4)  Plavix 75 Mg  Tabs (Clopidogrel Bisulfate) .... Once Daily 5)  Aspirin 81 Mg  Tabs (Aspirin) .... As Needed 6)  Levothroid 75 Mcg Tabs (Levothyroxine Sodium) .... Take 1 Tablet By Mouth Once A Day 7)  Tramadol Hcl 50 Mg Tabs (Tramadol Hcl) .Marland Kitchen.. 1 or 2 Three Times A Day As Needed For Pain 8)  Promethazine Hcl 12.5 Mg Tabs (Promethazine Hcl) .Marland Kitchen.. 1 Tab Q 6 Hours As Needed 9)  Metoclopramide Hcl 10 Mg Tabs (Metoclopramide Hcl) .Marland Kitchen.. 1 By Mouth Three Times A Day 10)  Midodrine Hcl 10 Mg Tabs (Midodrine Hcl) .Marland Kitchen.. 1 Tab Three Times A Day 11)  Glucerna  Liqd (Nutritional Supplements) .Marland Kitchen.. 1 Can Three Times A Day 12)  Furosemide 40 Mg Tabs (Furosemide) .Marland Kitchen.. 1 Tab Once Daily 13)  Potassium Chloride 20 Meq Pack (Potassium Chloride) .Marland Kitchen.. 1 Once Daily 14)  Sertraline Hcl 100 Mg Tabs (Sertraline Hcl) .Marland Kitchen.. 1 Tab Once Daily 15)  Amlodipine Besylate 2.5 Mg Tabs (Amlodipine Besylate) .Marland Kitchen.. 1 Tab Once Daily 16)  Acetaminophen 325 Mg Tabs (Acetaminophen) .... 2 Tabs Q 4 Hours As Needed 17)  Biscolax 10 Mg Supp (Bisacodyl) .Marland Kitchen.. 1 Rectaly As Needed  Allergies (verified): No Known Drug Allergies

## 2010-03-13 NOTE — Progress Notes (Signed)
Summary: FYI  Phone Note From Other Clinic   Caller: ERIN [phone] Summary of Call: FYI Pt has missed visit today, pt declined and will continue next week.  Initial call taken by: Lamar Sprinkles, CMA,  November 17, 2009 11:50 AM

## 2010-03-13 NOTE — Progress Notes (Signed)
Summary: FYI - BP UPDATE  Phone Note From Other Clinic   Caller: Baruch Gouty - PT Summary of Call: Therapist called with BP update from today's visit. BP sitting 132/70 Standing 90/64 Initial call taken by: Lamar Sprinkles, CMA,  October 20, 2009 2:06 PM  Follow-up for Phone Call        Noted. Will not make any change in medications at this point. Follow-up by: Jacques Navy MD,  October 20, 2009 4:48 PM

## 2010-03-13 NOTE — Progress Notes (Signed)
Summary: FYI - Home Health   Phone Note From Other Clinic   Caller: [phone] Denny Peon - PT Summary of Call: Physical Therapist- Erin called. FYI - pt reported a fall yesterday after getting up w/o using her walker. No visible injuries beside small bruise on right knee.  Initial call taken by: Lamar Sprinkles, CMA,  November 08, 2009 1:53 PM  Follow-up for Phone Call        noted. Follow-up by: Jacques Navy MD,  November 08, 2009 1:58 PM

## 2010-03-13 NOTE — Miscellaneous (Signed)
Summary: [place]  [place]   Imported By: Lester [place] 03/30/2009 08:02:50  _____________________________________________________________________  External Attachment:    Type:   Image     Comment:   External Document

## 2010-03-13 NOTE — Assessment & Plan Note (Signed)
Summary: B-12 / MEN /NWS   Nurse Visit   Allergies: No Known Drug Allergies  Medication Administration  Injection # 1:    Medication: Vit B12 1000 mcg    Diagnosis: B12 DEFICIENCY (ICD-266.2)    Route: IM    Site: R deltoid    Exp Date: 09/12/2010    Lot #: [id number]    Mfr: American Regent    Patient tolerated injection without complications    Given by: Rock Nephew CMA (November 30, 2009 2:06 PM)  Orders Added: 1)  Admin of Therapeutic Inj  intramuscular or subcutaneous [96372] 2)  Vit B12 1000 mcg [J3420]   Medication Administration  Injection # 1:    Medication: Vit B12 1000 mcg    Diagnosis: B12 DEFICIENCY (ICD-266.2)    Route: IM    Site: R deltoid    Exp Date: 09/12/2010    Lot #: [id number]    Mfr: American Regent    Patient tolerated injection without complications    Given by: Rock Nephew CMA (November 30, 2009 2:06 PM)  Orders Added: 1)  Admin of Therapeutic Inj  intramuscular or subcutaneous [96372] 2)  Vit B12 1000 mcg [J3420]

## 2010-03-13 NOTE — Assessment & Plan Note (Signed)
Summary: WHEELCHAIR DISCUSSION--PER PT--STC   Vital Signs:  Patient profile:   60 year old female Height:      65 inches Weight:      224 pounds BMI:     37.41 O2 Sat:      98 % on Room air Temp:     98.2 degrees F oral Pulse rate:   64 / minute BP sitting:   136 / 70  (left arm) Cuff size:   large  Vitals Entered By: Bill Salinas CMA (June 09, 2009 2:50 PM)  O2 Flow:  Room air CC: pt here for office visit to discuss scooter, pt states she is  no longer taking asa, tramadol,metformin, and is unsure if she amlodpine/ ab   Primary Care Provider:  Jacques Navy MD  CC:  pt here for office visit to discuss scooter, pt states she is  no longer taking asa, tramadol, metformin, and and is unsure if she amlodpine/ ab.  History of Present Illness: Patient is well known to the practice and followed for HTN, DM, h/o CVA with hemiparesis/weakness. She presents today for a mobility assessment for a power assisted scooter or w/c. He is able to ambulate and walked into the office.   Current Medications (verified): 1)  Carvedilol 25 Mg  Tabs (Carvedilol) .Marland Kitchen.. 1 By Mouth Two Times A Day 2)  Lantus 100 Unit/ml  Soln (Insulin Glargine) .... 50 Units Every A.m. 3)  Simvastatin 20 Mg  Tabs (Simvastatin) .... Take 1 Tablet By Mouth Once A Day 4)  Plavix 75 Mg  Tabs (Clopidogrel Bisulfate) .... Once Daily 5)  Sertraline Hcl 100 Mg  Tabs (Sertraline Hcl) .Marland Kitchen.. 1 Once Daily 6)  Aspirin 81 Mg  Tabs (Aspirin) .... As Needed 7)  Levothroid 75 Mcg Tabs (Levothyroxine Sodium) .... Take 1 Tablet By Mouth Once A Day 8)  Tramadol Hcl 50 Mg Tabs (Tramadol Hcl) .Marland Kitchen.. 1 or 2 Three Times A Day As Needed For Pain 9)  Metformin Hcl 1000 Mg Tabs (Metformin Hcl) .Marland Kitchen.. 1 Qam and 1 Pm 10)  Trazodone Hcl 50 Mg Tabs (Trazodone Hcl) .Marland Kitchen.. 1 Qhs 11)  Amlodipine Besylate 2.5 Mg Tabs (Amlodipine Besylate) .Marland Kitchen.. 1 Tab Daily 12)  Promethazine Hcl 12.5 Mg Tabs (Promethazine Hcl) .Marland Kitchen.. 1 Tab Q 6 Hours As Needed  Allergies  (verified): No Known Drug Allergies  Past History:  Past Medical History: Last updated: 05-10-2007 Coronary artery disease (right) Depression Diabetes mellitus, type II Hyperlipidemia Hypertension Cerebrovascular accident, hx of blind right eye - DM  Past Surgical History: Last updated: 07/28/2008 Appendectomy Caesarean section Cholecystectomy Endarterectomy Lumbar Diskectomy retinal detachment repair - "gas bubble"  June '10  Family History: Last updated: 10-May-2007 father-deceased @80 : CAD, HTN mother - deceased @ 37: cancer lung, DM, HTN Neg- breast or colon cancer; no CAD/MI  Social History: Last updated: 05/10/2007 [facility] [employer] [place] -'[address] married '72 1 daughter -'62 work: Systems analyst until disabled stable home situation.  Risk Factors: Alcohol Use: 0 (04/12/2009) Caffeine Use: 0 (2007/05/10) Exercise: no (05/10/07)  Risk Factors: Smoking Status: never (04/12/2009)  Review of Systems       The patient complains of dyspnea on exertion, peripheral edema, muscle weakness, and difficulty walking.  The patient denies anorexia, weight loss, weight gain, chest pain, prolonged cough, abdominal pain, and hematochezia.    Physical Exam  General:  obese white female in no distress Head:  normocephalic and atraumatic.   Eyes:  C&S cleaar, drooping left eye, loss  of vision left eye Lungs:  normal respiratory effort and normal breath sounds.   Heart:  normal rate and regular rhythm.   Msk:  Normal ROM UE and LE extremities. Able to stand with minmal assist by pushing herself up. [place] 100' with minimal balance assist only, able to pivot. Pulses:  2+ radial pulse Neurologic:  alert & oriented X3.  slight left facial droop. Skin:  turgor normal and color normal.   Psych:  Oriented X3, memory intact for recent and remote, normally interactive, and good eye contact.     Impression & Recommendations:  Problem # 1:  ATAXIA  (ICD-781.3) Reviewed with the patient the questionaire for mobility device application and she does not qualify for a device. I also encouraged her to remain as independently mobile as possible  - this being best for her health. I have encoraged her to use a rolling walker for balance and a light weight wheel chair for prolonged walking, e.g. when out shopping.  Complete Medication List: 1)  Carvedilol 25 Mg Tabs (Carvedilol) .Marland Kitchen.. 1 by mouth two times a day 2)  Lantus 100 Unit/ml Soln (Insulin glargine) .... 50 units every a.m. 3)  Simvastatin 20 Mg Tabs (Simvastatin) .... Take 1 tablet by mouth once a day 4)  Plavix 75 Mg Tabs (Clopidogrel bisulfate) .... Once daily 5)  Sertraline Hcl 100 Mg Tabs (Sertraline hcl) .Marland Kitchen.. 1 once daily 6)  Aspirin 81 Mg Tabs (Aspirin) .... As needed 7)  Levothroid 75 Mcg Tabs (Levothyroxine sodium) .... Take 1 tablet by mouth once a day 8)  Tramadol Hcl 50 Mg Tabs (Tramadol hcl) .Marland Kitchen.. 1 or 2 three times a day as needed for pain 9)  Metformin Hcl 1000 Mg Tabs (Metformin hcl) .Marland Kitchen.. 1 qam and 1 pm 10)  Trazodone Hcl 50 Mg Tabs (Trazodone hcl) .Marland Kitchen.. 1 qhs 11)  Amlodipine Besylate 2.5 Mg Tabs (Amlodipine besylate) .Marland Kitchen.. 1 tab daily 12)  Promethazine Hcl 12.5 Mg Tabs (Promethazine hcl) .Marland Kitchen.. 1 tab q 6 hours as needed

## 2010-03-13 NOTE — Progress Notes (Signed)
Summary: refill  Phone Note Refill Request Call back at Home Phone [phone] Message from:  Patient  Refills Requested: Medication #1:  FLUDROCORTISONE ACETATE 0.1 MG TABS 1 by mouth three times a day. for orthostatic hypotension.   Dosage confirmed as above?Dosage Confirmed  Please advise if ok to send in rx for #90 ?  Initial call taken by: Rock Nephew CMA,  October 23, 2009 3:56 PM  Follow-up for Phone Call        Yes. Changed med list to fludrocortisone 0.1mg  three times a day.  Follow-up by: Jacques Navy MD,  October 23, 2009 6:35 PM    Prescriptions: FLUDROCORTISONE ACETATE 0.1 MG TABS (FLUDROCORTISONE ACETATE) 1 by mouth three times a day. for orthostatic hypotension.  #90 x 2   Entered by:   Bill Salinas CMA   Authorized by:   Jacques Navy MD   Signed by:   Bill Salinas CMA on 10/24/2009   Method used:   [id number]       [address]       [place], [place]  [address]       Ph: [phone] or [phone]       Fax: [phone]   RxID:   [id number]

## 2010-03-13 NOTE — Miscellaneous (Signed)
Summary: Initial Summary for PT/[facility]  Initial Summary for PT/[facility]   Imported By: Sherian Rein 02/15/2009 09:04:59  _____________________________________________________________________  External Attachment:    Type:   Image     Comment:   External Document

## 2010-03-13 NOTE — Progress Notes (Signed)
Summary: REFILL  Phone Note Call from Patient   Summary of Call: Patient is requesting refill of Trazodone 50mg  qhs [facility]. Med was removed from EMR but pt says she still takes, Ok to update and send in rx for supply with refill's?  Initial call taken by: Lamar Sprinkles, CMA,  April 24, 2009 2:34 PM  Follow-up for Phone Call        ok for this Follow-up by: Corwin Levins MD,  April 24, 2009 5:32 PM    New/Updated Medications: TRAZODONE HCL 50 MG TABS (TRAZODONE HCL) 1 qhs Prescriptions: TRAZODONE HCL 50 MG TABS (TRAZODONE HCL) 1 qhs  #90 x 3   Entered by:   Ami Bullins CMA   Authorized by:   Jacques Navy MD   Signed by:   Bill Salinas CMA on 04/25/2009   Method used:   Faxed to ...       [facility]       [address] [place], [place]  [address]       Ph: [phone]       Fax: [phone]   RxID:   [id number]

## 2010-03-13 NOTE — Progress Notes (Signed)
Summary: [facility]  Phone Note Call from Patient   Summary of Call: Patient is requesting rx for scooter from the scooter store. Pt will need to get forms from company and schedule ov  (face to face eval) for completion of these. office visit is requirement of insurance.  Initial call taken by: Lamar Sprinkles, CMA,  May 31, 2009 11:33 AM  Follow-up for Phone Call        Pt informed of above Follow-up by: Lamar Sprinkles, CMA,  May 31, 2009 5:30 PM

## 2010-03-13 NOTE — Progress Notes (Signed)
Summary: FYI - Home Health   Phone Note From Other Clinic   Caller: Genevieve Norlander Summary of Call: [name] called w/FYI, they set up patient today for services.  Initial call taken by: Lamar Sprinkles, CMA,  October 09, 2009 4:16 PM

## 2010-03-13 NOTE — Letter (Signed)
Summary: [facility]   Imported By: Sherian Rein 05/30/2009 09:37:41  _____________________________________________________________________  External Attachment:    Type:   Image     Comment:   External Document

## 2010-03-13 NOTE — Letter (Signed)
Summary: [facility]   Imported By: Sherian Rein 07/25/2009 14:22:16  _____________________________________________________________________  External Attachment:    Type:   Image     Comment:   External Document

## 2010-03-13 NOTE — Miscellaneous (Signed)
Summary: [facility]   Imported By: Lester Ebro 11/24/2009 09:29:56  _____________________________________________________________________  External Attachment:    Type:   Image     Comment:   External Document

## 2010-03-13 NOTE — Progress Notes (Signed)
Summary: rx clarification  Phone Note From Pharmacy   Caller: [facility] fax--[phone] Summary of Call: Rec'd fax from pharmacy that prescription for Lantus could not be filled because something was illegible on the original prescription. Please clarify directions and re-fax.  **On the fax the med is listed as Lantus via 110u/ml  10ml vial #3 [id number]                        Please advise. Initial call taken by: Lucious Groves,  March 09, 2009 11:10 AM  Follow-up for Phone Call        reviewed last office note: Rx eScribed local x 1 month and mail order. Both eScripts for lantus say 70 international units once daily, 100u/ml. there was no handwriting to misread.  This did not need to come to my desk Follow-up by: Jacques Navy MD,  March 09, 2009 1:23 PM    Prescriptions: LANTUS 100 UNIT/ML  SOLN (INSULIN GLARGINE) 70 Units every A.M.  #3 mth supply x 3   Entered by:   Lamar Sprinkles, CMA   Authorized by:   Jacques Navy MD   Signed by:   Lamar Sprinkles, CMA on 03/09/2009   Method used:   Faxed to ...       [facility]       [address] [place], [place]  [address]       Ph: [phone]       Fax: [phone]   RxID:   [id number]

## 2010-03-13 NOTE — Progress Notes (Signed)
Summary: [facility]  Phone Note From Other Clinic   Caller: [phone] Summary of Call: [facility]report. Pt has ov tomorrow. She continues to have orthostatic hypotension, lying BP was 130/70, standing 90/64. They need activity guidelines for physical therapy.  Initial call taken by: Lamar Sprinkles, CMA,  October 11, 2009 3:31 PM  Follow-up for Phone Call        PT if patient is not overly symptomatic with BP drop Follow-up by: Jacques Navy MD,  October 12, 2009 8:02 AM  Additional Follow-up for Phone Call Additional follow up Details #1::        Pt has office visit today, per Genevieve Norlander, pt is very symptomatic w/bp drop. Please eval at office visit and advise.  Additional Follow-up by: Lamar Sprinkles, CMA,  October 12, 2009 10:48 AM    Additional Follow-up for Phone Call Additional follow up Details #2::    done Follow-up by: Jacques Navy MD,  October 12, 2009 3:13 PM

## 2010-03-13 NOTE — Progress Notes (Signed)
  Phone Note Other Incoming   Caller: Fayrene Fearing - Nuclear [facility]. [phone] Summary of Call: pt was unable to complete her Gastric Emptying study. Shortly after she completed her meal she threw up. Just and FYI Initial call taken by: Ami Bullins CMA,  May 15, 2009 2:00 PM

## 2010-03-13 NOTE — Progress Notes (Signed)
Summary: Sleep med?  Phone Note Call from Patient   Summary of Call: Patient is requesting rx for "mild" sleeping medication. Since being home from hospital pt has had trouble sleeping  Initial call taken by: Lamar Sprinkles, CMA,  October 09, 2009 11:30 AM  Follow-up for Phone Call        halcion (generic) 0.125mg  at bedtime as needed, #30refill x 5 Follow-up by: Jacques Navy MD,  October 09, 2009 12:39 PM  Additional Follow-up for Phone Call Additional follow up Details #1::        Pt informed, rx called in  Additional Follow-up by: Lamar Sprinkles, CMA,  October 09, 2009 2:36 PM    New/Updated Medications: HALCION 0.25 MG TABS (TRIAZOLAM) 1 at bedtime as needed Prescriptions: HALCION 0.25 MG TABS (TRIAZOLAM) 1 at bedtime as needed  #30 x 5   Entered by:   Lamar Sprinkles, CMA   Authorized by:   Jacques Navy MD   Signed by:   Lamar Sprinkles, CMA on 10/09/2009   Method used:   Telephoned to ...       [facility]  [id number]       [address]       [place], [place]  [address]       Ph: [phone] or [phone]       Fax: [phone]   RxID:   [id number]

## 2010-03-13 NOTE — Progress Notes (Signed)
  Phone Note Outgoing Call   Reason for Call: Discuss lab or test results Summary of Call: Please call patient: kidney function is slighly worse - Creatinine 4.6. Lab letter in the mail today. Please keep up fluid intake - at least 1 quart per day. If need - promethazine 12.5 mg by mouth or 25mg  suppositories to control nausea.  Thanks Initial call taken by: Jacques Navy MD,  April 28, 2009 5:16 AM  Follow-up for Phone Call        informed pt of results, can I get directions on Promethazine 12.5 mg so I can add to med list and call into pharm Follow-up by: Ami Bullins CMA,  April 28, 2009 12:01 PM  Additional Follow-up for Phone Call Additional follow up Details #1::        promethazine oral tabs or supps every 6 hours as needed Additional Follow-up by: Jacques Navy MD,  April 28, 2009 1:04 PM    New/Updated Medications: PROMETHAZINE HCL 12.5 MG TABS (PROMETHAZINE HCL)  PROMETHAZINE HCL 12.5 MG TABS (PROMETHAZINE HCL) 1 tab q 6 hours as needed Prescriptions: PROMETHAZINE HCL 12.5 MG TABS (PROMETHAZINE HCL) 1 tab q 6 hours as needed  #120 x 0   Entered by:   Ami Bullins CMA   Authorized by:   Jacques Navy MD   Signed by:   Bill Salinas CMA on 04/28/2009   Method used:   [facility] to        [facility]  [address] [id number]       [address]       [place], [place]  [address]       Ph: [phone] or [phone]       Fax: [phone]   RxID:   [id number]

## 2010-03-13 NOTE — Progress Notes (Signed)
  Phone Note Other Incoming   Caller: Alonza Bogus- pt's husband Summary of Call: Pt's husband was calling re. pt taking simvastatin and having side effects. I called pt's husband to get more detail. Waiting on call back Initial call taken by: Ami Bullins CMA,  August 03, 2009 3:28 PM  Follow-up for Phone Call        Pt's husband called and states that pt is exp. fatigue, weakness, and nausea and thinks this could be a side effect from the simvastatin. Pt's husband is concerned with her taking this medication. What do you advise for the pt Follow-up by: Ami Bullins CMA,  August 03, 2009 3:35 PM  Additional Follow-up for Phone Call Additional follow up Details #1::        D/C simvastatin even though it is at low, safe dose. See if symptoms abate. Report back to me. Additional Follow-up by: Jacques Navy MD,  August 03, 2009 5:47 PM    Additional Follow-up for Phone Call Additional follow up Details #2::    informed pt's husband. He will call back with an update Follow-up by: Ami Bullins CMA,  August 04, 2009 9:42 AM

## 2010-03-13 NOTE — Medication Information (Signed)
[facility]: [facility]   Imported By: Lester Halma 09/26/2009 10:20:21  _____________________________________________________________________  External Attachment:    Type:   Image     Comment:   External Document

## 2010-03-13 NOTE — Progress Notes (Signed)
Summary: Referral request  Phone Note Call from Patient   Summary of Call: Family member called asking if the pt needs referral to Dr. Bennie Dallas, [facility] [place] that she has not seen in 5+ years.  Please enter referral. Initial call taken by: Lucious Groves CMA,  September 04, 2009 2:53 PM  Follow-up for Phone Call        OK for referral. [facility] notified.  Follow-up by: Jacques Navy MD,  September 04, 2009 5:25 PM

## 2010-03-13 NOTE — Assessment & Plan Note (Signed)
Summary: STOMACH PROBLEM/NWS   #   Vital Signs:  Patient profile:   60 year old female Height:      65 inches Weight:      210 pounds BMI:     35.07 O2 Sat:      96 % on Room air Temp:     97.6 degrees F oral Pulse rate:   89 / minute BP sitting:   130 / 86  (left arm) Cuff size:   large  Vitals Entered By: Bill Salinas CMA (August 24, 2009 9:17 AM)  O2 Flow:  Room air Comments Pt wants to discuss simvastatin/ ab   Primary Care Provider:  Jacques Navy MD   History of Present Illness: Feels bad!. She has increased N/V. She was started on reglan 5mg  and it helped but her symptoms have gotten worse. Dr. Kathrene Bongo had them ncrease her dose to 10mg  AC/HS. Not eating much. She is very weak: can't get out of the bathtub, difficulty with walking.  Per her husband she is also very anxious and is fearful about her health. In her words she doesn't understand while all of these problems are suddenly happening.  Current Medications (verified): 1)  Carvedilol 25 Mg  Tabs (Carvedilol) .... 1/2 By Mouth Two Times A Day 2)  Lantus 100 Unit/ml  Soln (Insulin Glargine) .... 50 Units Every A.m. 3)  Simvastatin 20 Mg  Tabs (Simvastatin) .... Take 1 Tablet By Mouth Once A Day 4)  Plavix 75 Mg  Tabs (Clopidogrel Bisulfate) .... Once Daily 5)  Sertraline Hcl 100 Mg  Tabs (Sertraline Hcl) .Marland Kitchen.. 1 Once Daily 6)  Aspirin 81 Mg  Tabs (Aspirin) .... As Needed 7)  Levothroid 75 Mcg Tabs (Levothyroxine Sodium) .... Take 1 Tablet By Mouth Once A Day 8)  Tramadol Hcl 50 Mg Tabs (Tramadol Hcl) .Marland Kitchen.. 1 or 2 Three Times A Day As Needed For Pain 9)  Metformin Hcl 1000 Mg Tabs (Metformin Hcl) .Marland Kitchen.. 1 Qam and 1 Pm 10)  Trazodone Hcl 50 Mg Tabs (Trazodone Hcl) .Marland Kitchen.. 1 Qhs 11)  Amlodipine Besylate 2.5 Mg Tabs (Amlodipine Besylate) .Marland Kitchen.. 1 Tab Daily 12)  Promethazine Hcl 12.5 Mg Tabs (Promethazine Hcl) .Marland Kitchen.. 1 Tab Q 6 Hours As Needed 13)  Metoclopramide Hcl 5 Mg Tabs (Metoclopramide Hcl) .Marland Kitchen.. 1 Tab Three Times A  Day 14)  Midodrine Hcl 10 Mg Tabs (Midodrine Hcl) .Marland Kitchen.. 1 Tab Three Times A Day  Allergies (verified): No Known Drug Allergies  Past History:  Past Medical History: Last updated: April 18, 2007 Coronary artery disease (right) Depression Diabetes mellitus, type II Hyperlipidemia Hypertension Cerebrovascular accident, hx of blind right eye - DM  Past Surgical History: Last updated: 07/28/2008 Appendectomy Caesarean section Cholecystectomy Endarterectomy Lumbar Diskectomy retinal detachment repair - "gas bubble"  June '10  Family History: Last updated: 04/18/2007 father-deceased @80 : CAD, HTN mother - deceased @ 33: cancer lung, DM, HTN Neg- breast or colon cancer; no CAD/MI  Social History: Last updated: 04/18/2007 [facility] -'[address] Design married '74 1 daughter -'24 work: Systems analyst until disabled stable home situation.  Review of Systems       almost totally blind right eye - old finding. She reports that her left eye visual acuity seems worse. Her abdominal pain is more related to N/V than acute sharp pain.  Physical Exam  General:  overweight chronically ill appearing white female Head:  normocephalic and atraumatic.   Eyes:  lid lag OD. OS with normal C&S, pupil round  and reactive Neck:  supple.   Lungs:  normal respiratory effort, normal breath sounds, and no wheezes.   Heart:  normal rate, regular rhythm, and no murmur.   Abdomen:  soft.  Hypoactive bowel sounds Msk:  no joint tenderness, no joint swelling, and no joint warmth.   Pulses:  2+ radial Neurologic:  awake, oriented to person, place, context, examiner. Poor insight into her medical condition. Poor memory - but not formally tested. Skin:  turgor normal, color normal, and no rashes.   Psych:  normally interactive, good eye contact, depressed affect, flat affect, subdued, and moderately anxious.     Impression & Recommendations:  Problem # 1:  KIDNEY DISEASE, CHRONIC,  STAGE II (ICD-585.2) Patient with progressive CKD. GFR, by 24 hr quantative urine analysis, 11 ml/min with Cr 4.7. Discussed with her the natural course of CKD and that she has advance disease, understated by the serum Creatinine. She is informed that she will need to make a decision about dialysis in the not too distant future. 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  Plan - continue risk factor modification to maximize renal function.  Problem # 2:  NAUSEA AND VOMITING (ICD-787.01) Working diagnosis is diabetic gastroparesis. She was unable to coomplete a gastric emptying study due to N/V. She has no symptoms to suggest acid related disease. Question whether this and generalized symptoms could be parathyroid disease. Her iPTH is 143 and calcium is 10.8 per labs from nephrology.  Plan - increase metoclopromide to 10mg  three times a day .           check with Dr. Kathrene Bongo as to whether the iPTH level is greater than her degree of renal failure would explain. IF so she may be a                 candidate for sestabmibi scan for hyperparathyroid disease.  Problem # 3:  RETINAL DETACHMENT (ICD-361.9) With changing vision in her left eye she is advised to notify her opthalmologist at [facility] and perhaps move up her return visit appt.  Problem # 4:  CEREBROVASCULAR ACCIDENT, HX OF (ICD-V12.50) Patient with increasing memory loss and cognitive function. 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 Cannot rule out metabolic issues as contributing factor.  Problem # 5:  HYPERTENSION (ICD-401.9)  The following medications were removed from the medication list:    Amlodipine Besylate 2.5 Mg Tabs (Amlodipine besylate) .Marland Kitchen... 1 tab daily Her updated medication list for this problem includes:    Carvedilol 25 Mg Tabs (Carvedilol) .Marland Kitchen... 1/2 by mouth two times a  day  BP today: 130/86 Prior BP: 136/70 (06/09/2009)  Prior 10 Yr Risk Heart Disease: N/A (04/27/2008)  Good control on reduced medications.  Problem # 6:  HYPERLIPIDEMIA (ICD-272.4)  Her updated medication list for this problem includes:    Simvastatin 20 Mg Tabs (Simvastatin) .Marland Kitchen... Take 1 tablet by mouth once a day  Labs Reviewed: SGOT: 25 (10/21/2008)   SGPT: 19 (10/21/2008)  Prior 10 Yr Risk Heart Disease: N/A (04/27/2008)   HDL:46.00 (04/27/2008), 44.4 (07/31/2007)  LDL:95 (04/27/2008), 91 (07/31/2007)  Chol:163 (04/27/2008), 152 (07/31/2007)  Trig:108.0 (04/27/2008), 82 (07/31/2007)  No change in patients diffuse discomfort off simvastatin  Plan - resume simvastatin.  Problem # 7:  DIABETES MELLITUS, TYPE II (ICD-250.00)  The following medications were removed from the medication list:    Metformin Hcl 1000 Mg Tabs (  Metformin hcl) .Marland Kitchen... 1 qam and 1 pm Her updated medication list for this problem includes:    Lantus 100 Unit/ml Soln (Insulin glargine) .Marland KitchenMarland KitchenMarland KitchenMarland Kitchen 50 units every a.m.    Aspirin 81 Mg Tabs (Aspirin) .Marland Kitchen... As needed  Labs Reviewed: Creat: 4.6 (04/27/2009)     Last Eye Exam: normal (11/24/2007) Reviewed HgBA1c results: 5.4 (04/12/2009)  5.5 (10/21/2008)  Excellent control on present regimen  Problem # 8:  DEPRESSION (ICD-311) Patient and her husband do not see that sertraline has made any difference for her. Concern that medications may be contributing to her N/V  Plan - stop sertraline           after stopping sertraline may stop trazodone-watch for recurrent insomnia.  The following medications were removed from the medication list:    Sertraline Hcl 100 Mg Tabs (Sertraline hcl) .Marland Kitchen... 1 once daily    Trazodone Hcl 50 Mg Tabs (Trazodone hcl) .Marland Kitchen... 1 qhs  Problem # 9:  CORONARY ARTERY DISEASE (ICD-414.00) stable with risk factors well controlled  The following medications were removed from the medication list:    Amlodipine Besylate 2.5 Mg Tabs  (Amlodipine besylate) .Marland Kitchen... 1 tab daily Her updated medication list for this problem includes:    Carvedilol 25 Mg Tabs (Carvedilol) .Marland Kitchen... 1/2 by mouth two times a day    Plavix 75 Mg Tabs (Clopidogrel bisulfate) ..... Once daily    Aspirin 81 Mg Tabs (Aspirin) .Marland Kitchen... As needed  Complete Medication List: 1)  Carvedilol 25 Mg Tabs (Carvedilol) .... 1/2 by mouth two times a day 2)  Lantus 100 Unit/ml Soln (Insulin glargine) .... 50 units every a.m. 3)  Simvastatin 20 Mg Tabs (Simvastatin) .... Take 1 tablet by mouth once a day 4)  Plavix 75 Mg Tabs (Clopidogrel bisulfate) .... Once daily 5)  Aspirin 81 Mg Tabs (Aspirin) .... As needed 6)  Levothroid 75 Mcg Tabs (Levothyroxine sodium) .... Take 1 tablet by mouth once a day 7)  Tramadol Hcl 50 Mg Tabs (Tramadol hcl) .Marland Kitchen.. 1 or 2 three times a day as needed for pain 8)  Promethazine Hcl 12.5 Mg Tabs (Promethazine hcl) .Marland Kitchen.. 1 tab q 6 hours as needed 9)  Metoclopramide Hcl 10 Mg Tabs (Metoclopramide hcl) .Marland Kitchen.. 1 by mouth three times a day 10)  Midodrine Hcl 10 Mg Tabs (Midodrine hcl) .Marland Kitchen.. 1 tab three times a day 11)  Glucerna Liqd (Nutritional supplements) .Marland Kitchen.. 1 can three times a day Prescriptions: GLUCERNA  LIQD (NUTRITIONAL SUPPLEMENTS) 1 can three times a day  #100 cans x 5   Entered and Authorized by:   Jacques Navy MD   Signed by:   Jacques Navy MD on 08/24/2009   Method used:   [facility]  [address] [id number]       [address]       [place], [place]  [address]       Ph: [phone] or [phone]       Fax: [phone]   RxID:   [id number] METOCLOPRAMIDE HCL 10 MG TABS (METOCLOPRAMIDE HCL) 1 by mouth three times a day  #90 x 12   Entered and Authorized by:   Jacques Navy MD   Signed by:   Jacques Navy MD on 08/24/2009   Method used:   [facility]        [facility]  [address] [phone][facility] (retail[facility]  [address]       [place], [place]   [address]       Ph: [phone] or [phone]       Fax: [phone]   RxID:   [id number]

## 2010-03-13 NOTE — Assessment & Plan Note (Signed)
Summary: FU [name]  #   Vital Signs:  Patient profile:   60 year old female Height:      65 inches Weight:      237 pounds BMI:     39.58 O2 Sat:      96 % on Room air Temp:     97.6 degrees F oral Pulse rate:   68 / minute BP sitting:   126 / 62  (left arm) Cuff size:   large  Vitals Entered By: Bill Salinas CMA (February 27, 2009 1:57 PM)  O2 Flow:  Room air CC: pt here for a follow up office visit. At her last visit she c/o being off balence with dizziness and loss of appetite. Pt states symptoms haven't changed since lost ov. Pt is taking metformin and trazodone as she is unsure of dose and will call the office with that info so we can add to her file. / ab   Primary Care Provider:  Jacques Navy MD  CC:  pt here for a follow up office visit. At her last visit she c/o being off balence with dizziness and loss of appetite. Pt states symptoms haven't changed since lost ov. Pt is taking metformin and trazodone as she is unsure of dose and will call the office with that info so we can add to her file. / ab.  History of Present Illness: Patient presents for medical follow-up. she weighs in at 64, down from 267 March 17th, '09.   She wants to get a check up.   She is still having some vision problems: blind right eye; visual acuity left eye varies from day to day. She has had a detached retina, question of diabetic retinopathy.  Continues to have trouble with balance. She had MRI brain with no cerebellar or brainstem infarct or damage. Her opthalmologist says it is not related to her blindiness. she denies loss of sensation of the feet. thus it may be a problem of the vesitublar apparatus.     Allergies: No Known Drug Allergies  Past History:  Past Medical History: Last updated: May 16, 2007 Coronary artery disease (right) Depression Diabetes mellitus, type II Hyperlipidemia Hypertension Cerebrovascular accident, hx of blind right eye - DM  Past Surgical History: Last  updated: 07/28/2008 Appendectomy Caesarean section Cholecystectomy Endarterectomy Lumbar Diskectomy retinal detachment repair - "gas bubble"  June '10  Family History: Last updated: 2007-05-16 father-deceased @80 : CAD, HTN mother - deceased @ 66: cancer lung, DM, HTN Neg- breast or colon cancer; no CAD/MI  Social History: Last updated: 05/16/07 [employer] -'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  Risk Factors: Alcohol Use: 0 (10/21/2008) Caffeine Use: 0 (16-May-2007) Exercise: no (05/16/07)  Risk Factors: Smoking Status: never (05/11/2008)  Review of Systems       The patient complains of weight loss.  The patient denies anorexia, fever, weight gain, decreased hearing, hoarseness, chest pain, dyspnea on exertion, prolonged cough, hemoptysis, abdominal pain, hematochezia, incontinence, muscle weakness, suspicious skin lesions, difficulty walking, unusual weight change, and enlarged lymph nodes.    Physical Exam  General:  obese whtie female NAD Head:  slight right facial droop Eyes:  C&S clear, visual acuity not checked. Neck:  well healed endarterctomy scar right Chest Wall:  no deformities.   Lungs:  normal respiratory effort, normal breath sounds, no crackles, and no wheezes.   Heart:  normal rate, regular rhythm, no murmur, and no HJR.  Msk:  no joint tenderness, no joint swelling, no joint warmth, and no redness over joints.   Pulses:  2+ radial Neurologic:  alert & oriented X3, strength normal in all extremities, and gait normal.   Skin:  turgor normal and color normal.   Cervical Nodes:  no anterior cervical adenopathy and no posterior cervical adenopathy.   Psych:  Oriented X3, memory intact for recent and remote, normally interactive, and good eye contact.     Impression & Recommendations:  Problem # 1:  BENIGN POSITIONAL VERTIGO (ICD-386.11) From patient's  description, normal MRI brain, preserved sensation in her feet and not an opthal problem per her consulting ophtalmologist favor labyrinthitis as cause of her dysequilibrium.   Plan - trial of meclizine 12.5 mg for symptoms  Problem # 2:  HYPERTENSION (ICD-401.9)  Her updated medication list for this problem includes:    Carvedilol 25 Mg Tabs (Carvedilol) .Marland Kitchen... 1 by mouth two times a day    Ramipril 10 Mg Caps (Ramipril) .Marland Kitchen... 1 by mouth once daily    Furosemide 40 Mg Tabs (Furosemide) .Marland Kitchen... 1 by mouth once daily  BP today: 126/62 Prior BP: 124/78 (01/12/2009)  Prior 10 Yr Risk Heart Disease: N/A (04/27/2008)  Good control. Continue present medications.  Problem # 3:  RETINAL DETACHMENT (ICD-361.9) patient reports intermittent visual loss.   Plan - per opthalmology  Problem # 4:  DIABETES MELLITUS, TYPE II (ICD-250.00)  Her updated medication list for this problem includes:    Lantus 100 Unit/ml Soln (Insulin glargine) .Marland KitchenMarland KitchenMarland KitchenMarland Kitchen 70 units every a.m.    Ramipril 10 Mg Caps (Ramipril) .Marland Kitchen... 1 by mouth once daily    Aspirin 81 Mg Tabs (Aspirin) .Marland Kitchen... As needed  Labs Reviewed: Creat: 4.3 (10/21/2008)     Last Eye Exam: normal (11/24/2007) Reviewed HgBA1c results: 5.5 (10/21/2008)  7.4 (04/27/2008)  DM is stable at this time. She is due for f/u labs in April '11  Problem # 5:  HYPERLIPIDEMIA (ICD-272.4)  Her updated medication list for this problem includes:    Simvastatin 20 Mg Tabs (Simvastatin) .Marland Kitchen... Take 1 tablet by mouth once a day  Labs Reviewed: SGOT: 25 (10/21/2008)   SGPT: 19 (10/21/2008)  Prior 10 Yr Risk Heart Disease: N/A (04/27/2008)   HDL:46.00 (04/27/2008), 44.4 (07/31/2007)  LDL:95 (04/27/2008), 91 (07/31/2007)  Chol:163 (04/27/2008), 152 (07/31/2007)  Trig:108.0 (04/27/2008), 82 (07/31/2007)  Will get follow-up lab in April '11  Problem # 6:  Preventive Health Care (ICD-V70.0) Patient has lost 30 lbs in the last 9 months. She is current with colonscopy,  due for mammography, due for flu shot  Complete Medication List: 1)  Carvedilol 25 Mg Tabs (Carvedilol) .Marland Kitchen.. 1 by mouth two times a day 2)  Lantus 100 Unit/ml Soln (Insulin glargine) .... 70 units every a.m. 3)  Ramipril 10 Mg Caps (Ramipril) .Marland Kitchen.. 1 by mouth once daily 4)  Simvastatin 20 Mg Tabs (Simvastatin) .... Take 1 tablet by mouth once a day 5)  Plavix 75 Mg Tabs (Clopidogrel bisulfate) .... Once daily 6)  Sertraline Hcl 100 Mg Tabs (Sertraline hcl) .Marland Kitchen.. 1 once daily 7)  Aspirin 81 Mg Tabs (Aspirin) .... As needed 8)  Furosemide 40 Mg Tabs (Furosemide) .Marland Kitchen.. 1 by mouth once daily 9)  Levothroid 75 Mcg Tabs (Levothyroxine sodium) .... Take 1 tablet by mouth once a day 10)  Aleve Otc  .... Take 1 tablet by mouth once a day as needed Prescriptions: LEVOTHROID 75 MCG TABS (LEVOTHYROXINE SODIUM) Take 1 tablet by mouth once a  day  #30 x 0   Entered and Authorized by:   Jacques Navy MD   Signed by:   Jacques Navy MD on 02/27/2009   Method used:   Print then Give to Patient   RxID:   [id number] FUROSEMIDE 40 MG  TABS (FUROSEMIDE) 1 by mouth once daily  #30 x 0   Entered and Authorized by:   Jacques Navy MD   Signed by:   Jacques Navy MD on 02/27/2009   Method used:   Print then Give to Patient   RxID:   [id number] TRAMADOL HCL 50 MG TABS (TRAMADOL HCL) 1-2 by mouth q 6 hours as needed pain  #30 x 0   Entered and Authorized by:   Jacques Navy MD   Signed by:   Jacques Navy MD on 02/27/2009   Method used:   Print then Give to Patient   RxID:   [id number] SERTRALINE HCL 100 MG  TABS (SERTRALINE HCL) 1 once daily  #30 x 0   Entered and Authorized by:   Jacques Navy MD   Signed by:   Jacques Navy MD on 02/27/2009   Method used:   Print then Give to Patient   RxID:   [id number] PLAVIX 75 MG  TABS (CLOPIDOGREL BISULFATE) once daily  #30 x 0   Entered and Authorized by:   Jacques Navy MD   Signed by:   Jacques Navy MD on  02/27/2009   Method used:   Print then Give to Patient   RxID:   [id number] SIMVASTATIN 20 MG  TABS (SIMVASTATIN) Take 1 tablet by mouth once a day  #30 x 0   Entered and Authorized by:   Jacques Navy MD   Signed by:   Jacques Navy MD on 02/27/2009   Method used:   Print then Give to Patient   RxID:   [id number] RAMIPRIL 10 MG  CAPS (RAMIPRIL) 1 by mouth once daily  #30 x 0   Entered and Authorized by:   Jacques Navy MD   Signed by:   Jacques Navy MD on 02/27/2009   Method used:   Print then Give to Patient   RxID:   [id number] LANTUS 100 UNIT/ML  SOLN (INSULIN GLARGINE) 70 Units every A.M.  #1 vial x 0   Entered and Authorized by:   Jacques Navy MD   Signed by:   Jacques Navy MD on 02/27/2009   Method used:   Print then Give to Patient   RxID:   [id number] CARVEDILOL 25 MG  TABS (CARVEDILOL) 1 by mouth two times a day  #60 x 0   Entered and Authorized by:   Jacques Navy MD   Signed by:   Jacques Navy MD on 02/27/2009   Method used:   Print then Give to Patient   RxID:   [id number] CARVEDILOL 25 MG  TABS (CARVEDILOL) 1 by mouth two times a day  #180 x 3   Entered and Authorized by:   Jacques Navy MD   Signed by:   Jacques Navy MD on 02/27/2009   Method used:   Electronically to        [facility] ([facility]order)       [address]       [place], [place]  [address]       Ph: [phone]       Fax: [phone]  RxID:   [id number] LEVOTHROID 75 MCG TABS (LEVOTHYROXINE SODIUM) Take 1 tablet by mouth once a day  #90 x 3   Entered and Authorized by:   Jacques Navy MD   Signed by:   Jacques Navy MD on 02/27/2009   Method used:   Electronically to        [facility] ([facility]order)       [address]       [place], [place]  [address]       Ph: [phone]       Fax: [phone]   RxID:   [id number] FUROSEMIDE 40 MG  TABS (FUROSEMIDE) 1 by mouth once daily  #90 x 3    Entered and Authorized by:   Jacques Navy MD   Signed by:   Jacques Navy MD on 02/27/2009   Method used:   Electronically to        [facility] ([facility])       [address]       [place], [place]  [address]       Ph: [phone]       Fax: [phone]   RxID:   [id number] TRAMADOL HCL 50 MG TABS (TRAMADOL HCL) 1-2 by mouth q 6 hours as needed pain  #50 x 2   Entered and Authorized by:   Jacques Navy MD   Signed by:   Jacques Navy MD on 02/27/2009   Method used:   Electronically to        [facility] ([facility])       [address]       [place], [place]  [address]       Ph: [phone]       Fax: [phone]   RxID:   [id number] SERTRALINE HCL 100 MG  TABS (SERTRALINE HCL) 1 once daily  #90 x 3   Entered and Authorized by:   Jacques Navy MD   Signed by:   Jacques Navy MD on 02/27/2009   Method used:   Electronically to        [facility] ([facility])       [address]       [place], [place]  [address]       Ph: [phone]       Fax: [phone]   RxID:   [id number] PLAVIX 75 MG  TABS (CLOPIDOGREL BISULFATE) once daily  #90 x 3   Entered and Authorized by:   Jacques Navy MD   Signed by:   Jacques Navy MD on 02/27/2009   Method used:   Electronically to        [facility] ([facility])       [address]       [place], [place]  [address]       Ph: [phone]       Fax: [phone]   RxID:   [id number] SIMVASTATIN 20 MG  TABS (SIMVASTATIN) Take 1 tablet by mouth once a day  #90 x 3   Entered and Authorized by:   Jacques Navy MD   Signed by:   Jacques Navy MD on 02/27/2009   Method used:   Electronically to        [facility] ([facility])       [address]       [place], [place]  [address]       Ph: [phone]  Fax: [phone]   RxID:   [id number] RAMIPRIL 10 MG  CAPS (RAMIPRIL) 1 by mouth once daily  #90 x 3    Entered and Authorized by:   Jacques Navy MD   Signed by:   Jacques Navy MD on 02/27/2009   Method used:   Electronically to        [facility] ([facility])       [address]       [place], [place]  [address]       Ph: [phone]       Fax: [phone]   RxID:   [id number] LANTUS 100 UNIT/ML  SOLN (INSULIN GLARGINE) 70 Units every A.M.  #3vials x 3   Entered and Authorized by:   Jacques Navy MD   Signed by:   Jacques Navy MD on 02/27/2009   Method used:   Electronically to        [facility] ([facility]       [address]       [place], [place]  [address]       Ph: [phone]       Fax: [phone]   RxID:   [id number]

## 2010-03-13 NOTE — Miscellaneous (Signed)
Summary: [facility]   Imported By: [facility] 05/03/2009 07:35:03  _____________________________________________________________________  External Attachment:    Type:   Image     Comment:   External Document

## 2010-03-13 NOTE — Assessment & Plan Note (Signed)
Summary: PER [facility] D/T  FOR A 930A APPT-1 WK POST HOSP-DISCH'D: 04/15/09 P...   Vital Signs:  Patient profile:   60 year old female Height:      65 inches Weight:      227 pounds BMI:     37.91 O2 Sat:      97 % on Room air Temp:     97.6 degrees F oral Pulse rate:   63 / minute BP sitting:   116 / 80  (left arm) Cuff size:   large  Vitals Entered By: Bill Salinas CMA (April 27, 2009 9:38 AM)  O2 Flow:  Room air CC: pt here for hosp follow up/ab   Primary Care Provider:  Jacques Navy MD  CC:  pt here for hosp follow up/ab.  History of Present Illness: Patient hospitalized march 3-5 for acute renal insufficiency secondary to N/V. All hospital records reviewed including labs. Her creatinine rose to greater than 3. Her medications were changed stopping ramipril and substituting amlodipine. She was seen in consultation by Dr. Kathrene Bongo for nephrology.   Patient reports a several month history of poor appeitite and spontaneous N/V. She is a known diabetic for several years.   Since discharge she has had several additional episodes of N/V.   Current Medications (verified): 1)  Carvedilol 25 Mg  Tabs (Carvedilol) .Marland Kitchen.. 1 By Mouth Two Times A Day 2)  Lantus 100 Unit/ml  Soln (Insulin Glargine) .... 50 Units Every A.m. 3)  Simvastatin 20 Mg  Tabs (Simvastatin) .... Take 1 Tablet By Mouth Once A Day 4)  Plavix 75 Mg  Tabs (Clopidogrel Bisulfate) .... Once Daily 5)  Sertraline Hcl 100 Mg  Tabs (Sertraline Hcl) .Marland Kitchen.. 1 Once Daily 6)  Aspirin 81 Mg  Tabs (Aspirin) .... As Needed 7)  Levothroid 75 Mcg Tabs (Levothyroxine Sodium) .... Take 1 Tablet By Mouth Once A Day 8)  Tramadol Hcl 50 Mg Tabs (Tramadol Hcl) .Marland Kitchen.. 1 or 2 Three Times A Day As Needed For Pain 9)  Metformin Hcl 1000 Mg Tabs (Metformin Hcl) .Marland Kitchen.. 1 Qam and 1 Pm 10)  Trazodone Hcl 50 Mg Tabs (Trazodone Hcl) .Marland Kitchen.. 1 Qhs 11)  Amlodipine Besylate 2.5 Mg Tabs (Amlodipine Besylate) .Marland Kitchen.. 1 Tab Daily  Allergies (verified): No Known  Drug Allergies  Past History:  Past Medical History: Last updated: 05-11-2007 Coronary artery disease (right) Depression Diabetes mellitus, type II Hyperlipidemia Hypertension Cerebrovascular accident, hx of blind right eye - DM  Past Surgical History: Last updated: 07/28/2008 Appendectomy Caesarean section Cholecystectomy Endarterectomy Lumbar Diskectomy retinal detachment repair - "gas bubble"  June '10  Family History: Last updated: 05-11-07 father-deceased @80 : CAD, HTN mother - deceased @ 57: cancer lung, DM, HTN Neg- breast or colon cancer; no CAD/MI  Social History: Last updated: 05-11-07 [employer] -'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  Risk Factors: Alcohol Use: 0 (04/12/2009) Caffeine Use: 0 (05/11/07) Exercise: no (05/11/2007)  Risk Factors: Smoking Status: never (04/12/2009)  Review of Systems  The patient denies anorexia, fever, weight loss, vision loss, decreased hearing, hoarseness, chest pain, dyspnea on exertion, peripheral edema, abdominal pain, melena, severe indigestion/heartburn, muscle weakness, suspicious skin lesions, difficulty walking, unusual weight change, enlarged lymph nodes, and angioedema.    Physical Exam  General:  Obese white female in no distress.  Head:  Normocephalic and atraumatic without obvious abnormalities. No apparent alopecia or balding. Eyes:  right eye droop nad loss of  vision Neck:  supple and full ROM.   Lungs:  normal respiratory effort and normal breath sounds.   Heart:  normal rate and regular rhythm.   Abdomen:  obese, hypoactive BS   Impression & Recommendations:  Problem # 1:  KIDNEY DISEASE, CHRONIC, STAGE II (ICD-585.2) May have progressed re: CKD  Plan - keep appointment with nephrology           lab today: Bmet and 24 hr urine studies Orders: TLB-BMP (Basic Metabolic Panel-BMET)  (80048-METABOL) T-Urine 24 Hr. Protein [phone]) T-Urine 24 Hr. Creatinine Clearance ([id number])  Addendum: Patient: [name] Note: All result statuses are Final unless otherwise noted.  Tests: (1) BMP (METABOL)   Order Note: Critical result called to [facility]i on 04/27/2009 1:16 PM by Donzetta Sprung. Results were read back to caller. (CREA)   Sodium                    143 mEq/L                   135-145   Potassium                 3.9 mEq/L                   3.5-5.1   Chloride                  103 mEq/L                   96-112   Carbon Dioxide            32 mEq/L                    19-32   Glucose              [H]  102 mg/dL                   29-56   BUN                       23 mg/dL                    2-13   Creatinine           [HH] 4.6 mg/dL                   0.8-6.5   Calcium                   10.2 mg/dL                  7.8-46.9   GFR                       10.29 mL/min                >60  Problem # 2:  NAUSEA AND VOMITING (ICD-787.01) Suspect patient has diabetic gastroparesis  Plan - gastric emptying study.   Orders: Radiology Referral (Radiology)  Problem # 3:  HYPERTENSION (ICD-401.9)  The following medications were removed from the medication list:    Ramipril 10 Mg Caps (Ramipril) .Marland Kitchen... 1 by mouth once daily    Furosemide 40 Mg Tabs (Furosemide) .Marland Kitchen... 1 by mouth once daily Her updated medication list for this problem includes:    Carvedilol 25 Mg Tabs (Carvedilol) .Marland Kitchen... 1 by mouth two times a  day    Amlodipine Besylate 2.5 Mg Tabs (Amlodipine besylate) .Marland Kitchen... 1 tab daily  BP today: 116/80 Prior BP: 136/70 (04/12/2009)  Prior 10 Yr Risk Heart Disease: N/A (04/27/2008)  Good control on present regimen  Problem # 4:  DIABETES MELLITUS, TYPE II (ICD-250.00)  The following medications were removed from the medication list:    Ramipril 10 Mg Caps (Ramipril) .Marland Kitchen... 1 by mouth once daily Her updated medication list for this problem includes:    Lantus 100  Unit/ml Soln (Insulin glargine) .Marland KitchenMarland KitchenMarland KitchenMarland Kitchen 50 units every a.m.    Aspirin 81 Mg Tabs (Aspirin) .Marland Kitchen... As needed    Metformin Hcl 1000 Mg Tabs (Metformin hcl) .Marland Kitchen... 1 qam and 1 pm  Labs Reviewed: Creat: 3.9 (04/12/2009)     Last Eye Exam: normal (11/24/2007) Reviewed HgBA1c results: 5.4 (04/12/2009)  5.5 (10/21/2008)  Good control on present regimen.  Complete Medication List: 1)  Carvedilol 25 Mg Tabs (Carvedilol) .Marland Kitchen.. 1 by mouth two times a day 2)  Lantus 100 Unit/ml Soln (Insulin glargine) .... 50 units every a.m. 3)  Simvastatin 20 Mg Tabs (Simvastatin) .... Take 1 tablet by mouth once a day 4)  Plavix 75 Mg Tabs (Clopidogrel bisulfate) .... Once daily 5)  Sertraline Hcl 100 Mg Tabs (Sertraline hcl) .Marland Kitchen.. 1 once daily 6)  Aspirin 81 Mg Tabs (Aspirin) .... As needed 7)  Levothroid 75 Mcg Tabs (Levothyroxine sodium) .... Take 1 tablet by mouth once a day 8)  Tramadol Hcl 50 Mg Tabs (Tramadol hcl) .Marland Kitchen.. 1 or 2 three times a day as needed for pain 9)  Metformin Hcl 1000 Mg Tabs (Metformin hcl) .Marland Kitchen.. 1 qam and 1 pm 10)  Trazodone Hcl 50 Mg Tabs (Trazodone hcl) .Marland Kitchen.. 1 qhs 11)  Amlodipine Besylate 2.5 Mg Tabs (Amlodipine besylate) .Marland Kitchen.. 1 tab daily

## 2010-03-13 NOTE — Miscellaneous (Signed)
  Medications Added METFORMIN HCL 1000 MG TABS (METFORMIN HCL) 1 qam and 1 pm       Clinical Lists Changes  Medications: Added new medication of METFORMIN HCL 1000 MG TABS (METFORMIN HCL) 1 qam and 1 pm

## 2010-03-13 NOTE — Miscellaneous (Signed)
Summary: Plan/[facility]  Plan/[facility]   Imported By: Lester [facility] 12/12/2009 09:44:15  _____________________________________________________________________  External Attachment:    Type:   Image     Comment:   External Document

## 2010-03-13 NOTE — Miscellaneous (Signed)
Summary: Discharge from [facility]  Discharge from [facility]   Imported By: Sherian Rein 12/19/2009 09:28:52  _____________________________________________________________________  External Attachment:    Type:   Image     Comment:   External Document

## 2010-03-13 NOTE — Progress Notes (Signed)
  Phone Note Refill Request Message from:  Fax from Pharmacy on June 08, 2009 2:43 PM  Refills Requested: Medication #1:  SERTRALINE HCL 100 MG  TABS 1 once daily Initial call taken by: Ami Bullins CMA,  June 08, 2009 2:43 PM    Prescriptions: SERTRALINE HCL 100 MG  TABS (SERTRALINE HCL) 1 once daily  #30 x 6   Entered by:   Ami Bullins CMA   Authorized by:   Jacques Navy MD   Signed by:   Bill Salinas CMA on 06/08/2009   Method used:   [id number]       [address]       [place], [place]  [address]       Ph: [phone] or [phone]       Fax: [phone]   RxID:   [id number]

## 2010-03-13 NOTE — Miscellaneous (Signed)
Summary: Orders/[facility]  Orders/[facility]   Imported By: Sherian Rein 12/07/2009 13:21:10  _____________________________________________________________________  External Attachment:    Type:   Image     Comment:   External Document

## 2010-03-13 NOTE — Assessment & Plan Note (Signed)
Summary: B-12 / MEN /NWS   Nurse Visit   Allergies: No Known Drug Allergies  Medication Administration  Injection # 1:    Medication: Vit B12 1000 mcg    Diagnosis: B12 DEFICIENCY (ICD-266.2)    Route: IM    Site: R deltoid    Exp Date: 09/12/2010    Lot #: [id number]    Mfr: [employer] Regent    Patient tolerated injection without complications    Given by: Lamar Sprinkles, CMA (January 02, 2010 2:10 PM)  Orders Added: 1)  Admin of patients own med IM/SQ [96372M]   Medication Administration  Injection # 1:    Medication: Vit B12 1000 mcg    Diagnosis: B12 DEFICIENCY (ICD-266.2)    Route: IM    Site: R deltoid    Exp Date: 09/12/2010    Lot #: [id number]    Mfr: [employer] Regent    Patient tolerated injection without complications    Given by: Lamar Sprinkles, CMA (January 02, 2010 2:10 PM)  Orders Added: 1)  Admin of patients own med IM/SQ [phone]

## 2010-03-13 NOTE — Progress Notes (Signed)
Summary: Folic acid  Phone Note Call from Patient   Summary of Call: Pt's husband called. She needs rx for Folic acid 1mg  with directions and qty. Pt can get the remaining ingredients from [facility] but pharm reccomended getting RX.  Initial call taken by: Lamar Sprinkles, CMA,  October 24, 2009 4:05 PM  Follow-up for Phone Call        ok for folic acid 1 mg # 30 , sig 1 by mouth once daily  refill prn Follow-up by: Jacques Navy MD,  October 25, 2009 9:40 AM    New/Updated Medications: FOLIC ACID 1 MG TABS (FOLIC ACID) 1 once daily Prescriptions: FOLIC ACID 1 MG TABS (FOLIC ACID) 1 once daily  #90 x 3   Entered by:   Lamar Sprinkles, CMA   Authorized by:   Jacques Navy MD   Signed by:   Lamar Sprinkles, CMA on 10/25/2009   Method used:   [facility]        [id number]       [address]       [place], [place]  [address]       Ph: [phone] or [phone]       Fax: [phone]   RxID:   [id number]

## 2010-03-13 NOTE — Progress Notes (Signed)
Summary: BP readings  Phone Note Other Incoming   Caller: Baruch Gouty PT @ [facility] Summary of Call: Does Dr. Debby Bud want me to continue calling with pt's BP reading.  Her readings today were:127/64 sitting and 90/60 standing.  Please advise of any changes/ if he wants me to continue current plans.  # [id number] Initial call taken by: Lanier Prude, [facility]),  October 23, 2009 4:35 PM  Follow-up for Phone Call        Is the patient very symptomatic with position change? Please continue for the next two visits. thanks Follow-up by: Jacques Navy MD,  October 23, 2009 6:37 PM  Additional Follow-up for Phone Call Additional follow up Details #1::        Left vm for Erin.......................Marland KitchenLamar Sprinkles, CMA  October 24, 2009 10:55 AM   ERIN CALLED BACK. Pt c/o symptoms with position changes. Less with supine to sitting but sitting to standing after 2 to 3 minutes she has c/o lightheadness, dizzyness, feeling like she is going to faint. This happens daily about 80 % of the times she stands up. Denny Peon will call back later with further reports for MD.  Additional Follow-up by: Lamar Sprinkles, CMA,  October 24, 2009 12:11 PM    Additional Follow-up for Phone Call Additional follow up Details #2::    will increase fludrocortisone to 0.2 mg ( 2 x 0.1) two times a day to see if we can get better control on orthostatic hypotension. Follow-up by: Jacques Navy MD,  October 24, 2009 1:12 PM  Additional Follow-up for Phone Call Additional follow up Details #3:: Details for Additional Follow-up Action Taken: Pt's husband informed Additional Follow-up by: Lamar Sprinkles, CMA,  October 24, 2009 4:09 PM  New/Updated Medications: FLUDROCORTISONE ACETATE 0.1 MG TABS (FLUDROCORTISONE ACETATE) 2 two times a day Prescriptions: FLUDROCORTISONE ACETATE 0.1 MG TABS (FLUDROCORTISONE ACETATE) 2 two times a day  #120 x 1   Entered by:   Lamar Sprinkles, CMA   Authorized by:   Jacques Navy MD   Signed by:   Lamar Sprinkles, CMA on 10/24/2009   Method used:   [id number]       [address]       [place], [place]  [address]       Ph: [phone] or [phone]       Fax: [phone]   RxID:   [id number]

## 2010-03-13 NOTE — Progress Notes (Signed)
Summary: mail order refill request  Phone Note From Pharmacy   Caller: [facility] [phone] Summary of Call: [facility] requesting refill for carvedilol 25mg  (90 day supply w/ refills). Per rep, when called please refer to [id number] Initial call taken by: Rock Nephew CMA,  March 16, 2009 9:01 AM  Follow-up for Phone Call        spoke with Pharmacist Thayer Ohm, call was to verify  directions, phamacy could not read Rx. Follow-up by: Margaret Pyle, CMA,  March 16, 2009 10:21 AM

## 2010-03-13 NOTE — Letter (Signed)
   [facility] [address] [place], [place]  [address] Phone: [phone]      April 28, 2009   [name] [address] [place], [place] [address]  RE:  LAB RESULTS  Dear  [name],  The following is an interpretation of your most recent lab tests.  Please take note of any instructions provided or changes to medications that have resulted from your lab work.  ELECTROLYTES:  Good - no changes needed  KIDNEY FUNCTION TESTS:  Abnormal - schedule a follow-up appointment      Kidney function is decreased based on elevated creatinine. Please complete 24 hour urine studies. Work on hydration - at least 1 quart per day of fluid of choice.   Sincerely Yours,    Jacques Navy MD

## 2010-03-13 NOTE — Progress Notes (Signed)
  Phone Note Refill Request Message from:  [facility] nurse on October 06, 2009 1:14 PM  Refills Requested: Medication #1:  PROMETHAZINE HCL 12.5 MG TABS 1 tab q 6 hours as needed  Nurse called stating that MD discharg pt this am and need refill for her promethazine 12.5mg  [facility]  Initial call taken by: Rock Nephew CMA,  October 06, 2009 1:16 PM    Prescriptions: PROMETHAZINE HCL 12.5 MG TABS (PROMETHAZINE HCL) 1 tab q 6 hours as needed  #120 x 0   Entered by:   Rock Nephew CMA   Authorized by:   Jacques Navy MD   Signed by:   Rock Nephew CMA on 10/06/2009   Method used:   [facility]        [id number]       [address]       [place], [place]  [address]       Ph: [phone] or [phone]       Fax: [phone]   RxID:   [id number]

## 2010-03-13 NOTE — Assessment & Plan Note (Signed)
Summary: hospital follow up   Vital Signs:  Patient profile:   60 year old female Height:      65 inches Weight:      198 pounds BMI:     33.07 O2 Sat:      97 % on Room air Temp:     96.7 degrees F oral Pulse rate:   71 / minute BP sitting:   116 / 78  (left arm) Cuff size:   regular  Vitals Entered By: Bill Salinas CMA (October 12, 2009 2:03 PM)  O2 Flow:  Room air CC: Hosp follow up/ ab Comments PT states she is no longer taking metoclopramide, midodrine, glucerna, amlodipine or metanx/ ab   Primary Care Provider:  Jacques Navy MD  CC:  Hosp follow up/ ab.  History of Present Illness: Patient presents for hospitl follow-up. Her husband is with her and serves as a primary historian.  She was admitted for syncope and confusion. Hospital records reviewed. She was found to be anemic and was transfused. EPO level was normal. Anemia panel revealed normal iron stores and retic count but B12 was low and replacment was started.   She had significant problems with memory and confusion. Neuro saw patient with no specific findings. 2D echo and carotid dopplers were normal. Question of multi-infarct dementia.  Patient with severe othostatic hypotension documented despite midodrine so she was changed to florinef and at discharge was taking 0.1 mg two times a day. At home she has continued to have significant positional drop in BP. She has not had signs of fluid retention or hypertension on this regimen.  During hospital stay she hade variable blood sugar levels and at home prior to admission had hypoglycemic episodes frequently. At time of d/c lantus dose was reduce. Since being home she has not had significant hypoglycemia.  Chronic renal failure remained stable with a d/c creatinine of 3.66.  Current Medications (verified): 1)  Carvedilol 25 Mg  Tabs (Carvedilol) .... 1/2 By Mouth Two Times A Day 2)  Lantus 100 Unit/ml  Soln (Insulin Glargine) .... 25 Units Every A.m. 3)   Simvastatin 20 Mg  Tabs (Simvastatin) .... Take 1 Tablet By Mouth Once A Day 4)  Plavix 75 Mg  Tabs (Clopidogrel Bisulfate) .... Once Daily 5)  Aspirin 81 Mg  Tabs (Aspirin) .... As Needed 6)  Levothroid 75 Mcg Tabs (Levothyroxine Sodium) .... Take 1 Tablet By Mouth Once A Day 7)  Tramadol Hcl 50 Mg Tabs (Tramadol Hcl) .Marland Kitchen.. 1 or 2 Three Times A Day As Needed For Pain 8)  Promethazine Hcl 12.5 Mg Tabs (Promethazine Hcl) .Marland Kitchen.. 1 Tab Q 6 Hours As Needed 9)  Metoclopramide Hcl 10 Mg Tabs (Metoclopramide Hcl) .Marland Kitchen.. 1 By Mouth Three Times A Day 10)  Midodrine Hcl 10 Mg Tabs (Midodrine Hcl) .Marland Kitchen.. 1 Tab Three Times A Day 11)  Glucerna  Liqd (Nutritional Supplements) .Marland Kitchen.. 1 Can Three Times A Day 12)  Furosemide 40 Mg Tabs (Furosemide) .Marland Kitchen.. 1 Tab Once Daily 13)  Potassium Chloride 20 Meq Pack (Potassium Chloride) .Marland Kitchen.. 1 Once Daily 14)  Sertraline Hcl 100 Mg Tabs (Sertraline Hcl) .Marland Kitchen.. 1 Tab Once Daily 15)  Amlodipine Besylate 2.5 Mg Tabs (Amlodipine Besylate) .Marland Kitchen.. 1 Tab Once Daily 16)  Acetaminophen 325 Mg Tabs (Acetaminophen) .... 2 Tabs Q 4 Hours As Needed 17)  Biscolax 10 Mg Supp (Bisacodyl) .Marland Kitchen.. 1 Rectaly As Needed 18)  Halcion 0.25 Mg Tabs (Triazolam) .Marland Kitchen.. 1 At Bedtime As Needed 19)  Cyanocobalamin 1000 Mcg/ml Soln (Cyanocobalamin) .Marland Kitchen.. 1 Injection Monthly 20)  Fludrocortisone Acetate 0.1 Mg Tabs (Fludrocortisone Acetate) .Marland Kitchen.. 1 By Mouth Two Times A Day 21)  Loratadine 10 Mg Tabs (Loratadine) .Marland Kitchen.. 1 Tab Once Daily  Allergies (verified): No Known Drug Allergies  Past History:  Past Medical History: Last updated: 15-May-2007 Coronary artery disease (right) Depression Diabetes mellitus, type II Hyperlipidemia Hypertension Cerebrovascular accident, hx of blind right eye - DM  Past Surgical History: Last updated: 07/28/2008 Appendectomy Caesarean section Cholecystectomy Endarterectomy Lumbar Diskectomy retinal detachment repair - "gas bubble"  June '10  Family History: Last updated:  May 15, 2007 father-deceased @80 : CAD, HTN mother - deceased @ 83: cancer lung, DM, HTN Neg- breast or colon cancer; no CAD/MI  Social History: Last updated: May 15, 2007 [facility] [employer] [place] [employer] married '74 1 daughter -'82 work: Systems analyst until disabled stable home situation.  Risk Factors: Alcohol Use: 0 (04/12/2009) Caffeine Use: 0 (May 15, 2007) Exercise: no (May 15, 2007)  Risk Factors: Smoking Status: never (04/12/2009)  Review of Systems       The patient complains of abdominal pain and difficulty walking.  The patient denies anorexia, fever, weight loss, weight gain, vision loss, decreased hearing, chest pain, syncope, dyspnea on exertion, headaches, severe indigestion/heartburn, muscle weakness, abnormal bleeding, and angioedema.    Physical Exam  General:  overweight white female in a W/C in no distress Head:  normocephalic and atraumatic.   Eyes:  conjunctive clear.  Neck:  supple.   Chest Wall:  no deformities.   Lungs:  normal respiratory effort, no intercostal retractions, no accessory muscle use, normal breath sounds, and no wheezes.   Heart:  normal rate, regular rhythm, and no JVD.   Abdomen:  soft and normal bowel sounds.   Msk:  no joint tenderness, no joint swelling, no joint warmth, and no redness over joints.   Pulses:  2+ radial pulses Extremities:  slight pedal edema Neurologic:  oriented to person, place, examiner. Slight chronic right facial droop. No additional exam Skin:  multiple bruises on arms ( hospital acquired) thighs and abdomen. No open lesions Cervical Nodes:  no anterior cervical adenopathy and no posterior cervical adenopathy.   Psych:  oriented to person, place, examiner. Good interaction,good eye contract.   Impression & Recommendations:  Problem # 1:  KIDNEY DISEASE, CHRONIC, STAGE II (ICD-585.2) At this point she is at a CKD III level with progressive declince in renal function.  Plan - follow-up with Dr.  Kathrene Bongo  Problem # 2:  NAUSEA AND VOMITING (ICD-787.01) Improved altough she still has early satiety and prolonged sense of fullness. She has established gastroparesis but had adverse reaction to reglan with severe mental status changes. She is not have a lot of vomiting or marked discomfort.   Plan - if symptoms become more problematic can try erythromycin.  Problem # 3:  HYPERTENSION (ICD-401.9)  Her updated medication list for this problem includes:    Carvedilol 25 Mg Tabs (Carvedilol) .Marland Kitchen... 1/2 by mouth two times a day    Furosemide 40 Mg Tabs (Furosemide) .Marland Kitchen... 1 tab once daily    Amlodipine Besylate 2.5 Mg Tabs (Amlodipine besylate) .Marland Kitchen... 1 tab once daily  BP today: 116/78 Prior BP: 130/86 (08/24/2009)  Prior 10 Yr Risk Heart Disease: N/A (04/27/2008)  Acceptable BP. If frequent episodes of hypotension will drop amlodipine.  Problem # 4:  DIABETES MELLITUS, TYPE II (ICD-250.00) Per her husband her CBGs have been fairly stable without marked hypoglycemia. she is currently taking Lantus 25  units only. Last A1C revealed adequate control.   Plan - continue present regiment. She and her husband understand that too high is preferable to too low.    Her updated medication list for this problem includes:    Lantus 100 Unit/ml Soln (Insulin glargine) .Marland Kitchen... 25 units every a.m.    Aspirin 81 Mg Tabs (Aspirin) .Marland Kitchen... As needed  Problem # 5:  ORTHOSTATIC HYPOTENSION (ICD-458.0) Continued symptomatic drops in BP  Plan - increase forinef to 0.1mg  three times a day.          will continue to tritrate up as needed with a careful eye on fluid status and blood pressure.  Complete Medication List: 1)  Carvedilol 25 Mg Tabs (Carvedilol) .... 1/2 by mouth two times a day 2)  Lantus 100 Unit/ml Soln (Insulin glargine) .... 25 units every a.m. 3)  Simvastatin 20 Mg Tabs (Simvastatin) .... Take 1 tablet by mouth once a day 4)  Plavix 75 Mg Tabs (Clopidogrel bisulfate) .... Once daily 5)   Aspirin 81 Mg Tabs (Aspirin) .... As needed 6)  Levothroid 75 Mcg Tabs (Levothyroxine sodium) .... Take 1 tablet by mouth once a day 7)  Tramadol Hcl 50 Mg Tabs (Tramadol hcl) .Marland Kitchen.. 1 or 2 three times a day as needed for pain 8)  Promethazine Hcl 12.5 Mg Tabs (Promethazine hcl) .Marland Kitchen.. 1 tab q 6 hours as needed 9)  Glucerna Liqd (Nutritional supplements) .Marland Kitchen.. 1 can three times a day 10)  Furosemide 40 Mg Tabs (Furosemide) .Marland Kitchen.. 1 tab once daily 11)  Potassium Chloride 20 Meq Pack (Potassium chloride) .Marland Kitchen.. 1 once daily 12)  Sertraline Hcl 100 Mg Tabs (Sertraline hcl) .Marland Kitchen.. 1 tab once daily 13)  Amlodipine Besylate 2.5 Mg Tabs (Amlodipine besylate) .Marland Kitchen.. 1 tab once daily 14)  Acetaminophen 325 Mg Tabs (Acetaminophen) .... 2 tabs q 4 hours as needed 15)  Biscolax 10 Mg Supp (Bisacodyl) .Marland Kitchen.. 1 rectaly as needed 16)  Halcion 0.25 Mg Tabs (Triazolam) .Marland Kitchen.. 1 at bedtime as needed 17)  Cyanocobalamin 1000 Mcg/ml Soln (Cyanocobalamin) .Marland Kitchen.. 1 injection monthly 18)  Fludrocortisone Acetate 0.1 Mg Tabs (Fludrocortisone acetate) .Marland Kitchen.. 1 by mouth three times a day. for orthostatic hypotension. 19)  Loratadine 10 Mg Tabs (Loratadine) .Marland Kitchen.. 1 tab once daily  Other Orders: Flu Vaccine 39yrs + (13086) Administration Flu vaccine - MCR (V7846)  Flu Vaccine Consent Questions     Do you have a history of severe allergic reactions to this vaccine? no    Any prior history of allergic reactions to egg and/or gelatin? no    Do you have a sensitivity to the preservative Thimersol? no    Do you have a past history of Guillan-Barre Syndrome? no    Do you currently have an acute febrile illness? no    Have you ever had a severe reaction to latex? no    Vaccine information given and explained to patient? yes    Are you currently pregnant? no    Lot Number:[id number]   Exp Date:08/11/2010   Site Given  Left Deltoid IM  Lanier Prude, [facility])  October 12, 2009 2:47 PM

## 2010-03-13 NOTE — Assessment & Plan Note (Signed)
Summary: DR MEN PT--VOMITING--STC   Vital Signs:  Patient profile:   60 year old female Height:      65 inches Weight:      228.25 pounds BMI:     38.12 O2 Sat:      96 % on Room air Temp:     97.2 degrees F oral Pulse rate:   63 / minute Pulse rhythm:   regular Resp:     16 per minute BP sitting:   136 / 70  (left arm) Cuff size:   large  Vitals Entered By: Rock Nephew CMA (April 12, 2009 10:00 AM)  Nutrition Counseling: Patient's BMI is greater than 25 and therefore counseled on weight management options.  O2 Flow:  Room air CC: vomiting, loss of appetitie Is Patient Diabetic? Yes Did you bring your meter with you today? No Pain Assessment Patient in pain? no        Primary Care Provider:  Jacques Navy MD  CC:  vomiting and loss of appetitie.  History of Present Illness: She returns with worsening N/V/disorientation. She was admitted last Sept. with ARF and Creat. just above 4, she was rehydrated at that time and Creat. came down to just above 1. It appears that her renal function has not been rechecked since then and it looks like an ACEI was recently started.  Preventive Screening-Counseling & Management  Alcohol-Tobacco     Alcohol drinks/day: 0     Smoking Status: never     Year Quit: 2000     Pack years: 20  Clinical Review Panels:  Lipid Management   Cholesterol:  163 (04/27/2008)   LDL (bad choesterol):  95 (04/27/2008)   HDL (good cholesterol):  46.00 (04/27/2008)  Diabetes Management   HgBA1C:  5.5 (10/21/2008)   Creatinine:  4.3 (10/21/2008)   Last Dilated Eye Exam:  normal (11/24/2007)   Last Foot Exam:  yes (04/12/2009)   Last Flu Vaccine:  Fluvax 3+ (11/09/2008)  CBC   WBC:  8.9 (10/21/2008)   RBC:  4.63 (10/21/2008)   Hgb:  15.0 (10/21/2008)   Hct:  43.7 (10/21/2008)   Platelets:  203.0 (10/21/2008)   MCV  94.4 (10/21/2008)   MCHC  34.2 (10/21/2008)   RDW  12.2 (10/21/2008)   PMN:  72.8 (10/21/2008)   Lymphs:  20.7  (10/21/2008)   Monos:  5.0 (10/21/2008)   Eosinophils:  1.1 (10/21/2008)   Basophil:  0.4 (10/21/2008)  Complete Metabolic Panel   Glucose:  219 (10/21/2008)   Sodium:  140 (10/21/2008)   Potassium:  4.5 (10/21/2008)   Chloride:  103 (10/21/2008)   CO2:  28 (10/21/2008)   BUN:  61 (10/21/2008)   Creatinine:  4.3 (10/21/2008)   Albumin:  4.0 (10/21/2008)   Total Protein:  7.4 (10/21/2008)   Calcium:  9.7 (10/21/2008)   Total Bili:  1.3 (10/21/2008)   Alk Phos:  56 (10/21/2008)   SGPT (ALT):  19 (10/21/2008)   SGOT (AST):  25 (10/21/2008)   Medications Prior to Update: 1)  Carvedilol 25 Mg  Tabs (Carvedilol) .Marland Kitchen.. 1 By Mouth Two Times A Day 2)  Lantus 100 Unit/ml  Soln (Insulin Glargine) .... 70 Units Every A.m. 3)  Ramipril 10 Mg  Caps (Ramipril) .Marland Kitchen.. 1 By Mouth Once Daily 4)  Simvastatin 20 Mg  Tabs (Simvastatin) .... Take 1 Tablet By Mouth Once A Day 5)  Plavix 75 Mg  Tabs (Clopidogrel Bisulfate) .... Once Daily 6)  Sertraline Hcl 100 Mg  Tabs (  Sertraline Hcl) .Marland Kitchen.. 1 Once Daily 7)  Aspirin 81 Mg  Tabs (Aspirin) .... As Needed 8)  Furosemide 40 Mg  Tabs (Furosemide) .Marland Kitchen.. 1 By Mouth Once Daily 9)  Levothroid 75 Mcg Tabs (Levothyroxine Sodium) .... Take 1 Tablet By Mouth Once A Day 10)  Aleve Otc .... Take 1 Tablet By Mouth Once A Day As Needed 11)  Tramadol Hcl 50 Mg Tabs (Tramadol Hcl) .Marland Kitchen.. 1 or 2 Three Times A Day As Needed For Pain  Current Medications (verified): 1)  Carvedilol 25 Mg  Tabs (Carvedilol) .Marland Kitchen.. 1 By Mouth Two Times A Day 2)  Lantus 100 Unit/ml  Soln (Insulin Glargine) .... 70 Units Every A.m. 3)  Ramipril 10 Mg  Caps (Ramipril) .Marland Kitchen.. 1 By Mouth Once Daily 4)  Simvastatin 20 Mg  Tabs (Simvastatin) .... Take 1 Tablet By Mouth Once A Day 5)  Plavix 75 Mg  Tabs (Clopidogrel Bisulfate) .... Once Daily 6)  Sertraline Hcl 100 Mg  Tabs (Sertraline Hcl) .Marland Kitchen.. 1 Once Daily 7)  Aspirin 81 Mg  Tabs (Aspirin) .... As Needed 8)  Furosemide 40 Mg  Tabs (Furosemide) .Marland Kitchen.. 1 By  Mouth Once Daily 9)  Levothroid 75 Mcg Tabs (Levothyroxine Sodium) .... Take 1 Tablet By Mouth Once A Day 10)  Aleve Otc .... Take 1 Tablet By Mouth Once A Day As Needed 11)  Tramadol Hcl 50 Mg Tabs (Tramadol Hcl) .Marland Kitchen.. 1 or 2 Three Times A Day As Needed For Pain  Allergies (verified): No Known Drug Allergies  Past History:  Past Medical History: Reviewed history from 04/17/2007 and no changes required. Coronary artery disease (right) Depression Diabetes mellitus, type II Hyperlipidemia Hypertension Cerebrovascular accident, hx of blind right eye - DM  Past Surgical History: Reviewed history from 07/28/2008 and no changes required. Appendectomy Caesarean section Cholecystectomy Endarterectomy Lumbar Diskectomy retinal detachment repair - "gas bubble"  June '10  Family History: Reviewed history from 04/17/2007 and no changes required. father-deceased @80 : CAD, HTN mother - deceased @ 54: cancer lung, DM, HTN Neg- breast or colon cancer; no CAD/MI  Social History: Reviewed history from 04/17/2007 and no changes required. [employer] [place] -[employer] 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  Review of Systems       The patient complains of anorexia and weight loss.  The patient denies fever, weight gain, chest pain, syncope, dyspnea on exertion, peripheral edema, prolonged cough, headaches, hemoptysis, abdominal pain, melena, hematochezia, severe indigestion/heartburn, hematuria, suspicious skin lesions, difficulty walking, and depression.   GI:  Complains of loss of appetite, nausea, and vomiting; denies abdominal pain, bloody stools, change in bowel habits, constipation, diarrhea, hemorrhoids, indigestion, vomiting blood, and yellowish skin color. GU:  Denies discharge, dysuria, hematuria, incontinence, nocturia, urinary frequency, and urinary hesitancy. Neuro:  Complains of difficulty with concentration  and memory loss; denies brief paralysis, disturbances in coordination, headaches, inability to speak, numbness, seizures, tingling, tremors, and weakness. Endo:  Complains of weight change; denies cold intolerance, excessive hunger, excessive thirst, excessive urination, heat intolerance, and polyuria.  Physical Exam  General:  obese whtie female NAD Head:  normocephalic, atraumatic, no abnormalities observed, and no abnormalities palpated.   Eyes:  C&S clear, visual acuity not checked. mm are pale. Mouth:  pharynx pink and moist.     Neck:  No deformities, masses, or tenderness noted. Lungs:  Normal respiratory effort, chest expands symmetrically. Lungs are clear to auscultation, no crackles or wheezes. Heart:  Normal  rate and regular rhythm. S1 and S2 normal without gallop, murmur, click, rub or other extra sounds. Abdomen:  Bowel sounds positive,abdomen soft and non-tender without masses, organomegaly or hernias noted. Msk:  no joint tenderness, no joint swelling, no joint warmth, and no redness over joints.   Pulses:  R and L carotid,radial,femoral,dorsalis pedis and posterior tibial pulses are full and equal bilaterally Extremities:  No clubbing, cyanosis, edema, or deformity noted with normal full range of motion of all joints.   Neurologic:  cranial nerves II-XII intact, strength normal in all extremities, gait normal, DTRs symmetrical and normal, toes down bilaterally on Babinski, and confused.   Skin:  turgor normal, color normal, no rashes, no suspicious lesions, no ecchymoses, no petechiae, no purpura, no ulcerations, and no edema.   Cervical Nodes:  no anterior cervical adenopathy and no posterior cervical adenopathy.   Axillary Nodes:  no R axillary adenopathy and no L axillary adenopathy.   Psych:  good eye contact, not anxious appearing, not depressed appearing, not agitated, not suicidal, flat affect, withdrawn, poor concentration, memory impairment, and judgment fair.     Diabetes Management Exam:    Foot Exam (with socks and/or shoes not present):       Sensory-Pinprick/Light touch:          Left medial foot (L-4): normal          Left dorsal foot (L-5): normal          Left lateral foot (S-1): normal          Right medial foot (L-4): normal          Right dorsal foot (L-5): normal          Right lateral foot (S-1): normal       Sensory-Monofilament:          Left foot: normal          Right foot: normal       Inspection:          Left foot: normal          Right foot: normal       Nails:          Left foot: normal          Right foot: normal   Impression & Recommendations:  Problem # 1:  KIDNEY DISEASE, CHRONIC, STAGE II (ICD-585.2) Assessment Deteriorated  she'll need an admission for this with IV fluids since N and V have worsened, Meds will have to be adjusted and possible look for RAS, etc. Orders: Venipuncture (03474) TLB-BMP (Basic Metabolic Panel-BMET) (80048-METABOL) TLB-CBC Platelet - w/Differential (85025-CBCD) TLB-Amylase (82150-AMYL)  Labs Reviewed: BUN: 61 (10/21/2008)   Cr: 4.3 (10/21/2008)    Hgb: 15.0 (10/21/2008)   Hct: 43.7 (10/21/2008)   Ca++: 9.7 (10/21/2008)    TP: 7.4 (10/21/2008)   Alb: 4.0 (10/21/2008)  Problem # 2:  ANOREXIA (ICD-783.0) Assessment: Deteriorated  Problem # 3:  NAUSEA AND VOMITING (ICD-787.01) Assessment: Deteriorated  Problem # 4:  DIABETES MELLITUS, TYPE II (ICD-250.00) Assessment: Unchanged  Her updated medication list for this problem includes:    Lantus 100 Unit/ml Soln (Insulin glargine) .Marland KitchenMarland KitchenMarland KitchenMarland Kitchen 70 units every a.m.    Ramipril 10 Mg Caps (Ramipril) .Marland Kitchen... 1 by mouth once daily    Aspirin 81 Mg Tabs (Aspirin) .Marland Kitchen... As needed  Orders: Venipuncture ([id number]) TLB-BMP (Basic Metabolic Panel-BMET) (80048-METABOL) TLB-CBC Platelet - w/Differential (85025-CBCD) TLB-Amylase (82150-AMYL) TLB-A1C / Hgb A1C (Glycohemoglobin) (83036-A1C)  Labs Reviewed: Creat: 4.3 (10/21/2008)  Last Eye  Exam: normal (11/24/2007) Reviewed HgBA1c results: 5.5 (10/21/2008)  7.4 (04/27/2008)  Complete Medication List: 1)  Carvedilol 25 Mg Tabs (Carvedilol) .Marland Kitchen.. 1 by mouth two times a day 2)  Lantus 100 Unit/ml Soln (Insulin glargine) .... 70 units every a.m. 3)  Ramipril 10 Mg Caps (Ramipril) .Marland Kitchen.. 1 by mouth once daily 4)  Simvastatin 20 Mg Tabs (Simvastatin) .... Take 1 tablet by mouth once a day 5)  Plavix 75 Mg Tabs (Clopidogrel bisulfate) .... Once daily 6)  Sertraline Hcl 100 Mg Tabs (Sertraline hcl) .Marland Kitchen.. 1 once daily 7)  Aspirin 81 Mg Tabs (Aspirin) .... As needed 8)  Furosemide 40 Mg Tabs (Furosemide) .Marland Kitchen.. 1 by mouth once daily 9)  Levothroid 75 Mcg Tabs (Levothyroxine sodium) .... Take 1 tablet by mouth once a day 10)  Aleve Otc  .... Take 1 tablet by mouth once a day as needed 11)  Tramadol Hcl 50 Mg Tabs (Tramadol hcl) .Marland Kitchen.. 1 or 2 three times a day as needed for pain  Patient Instructions: 1)  Please schedule a follow-up appointment in 2 weeks.

## 2010-03-13 NOTE — Progress Notes (Signed)
Summary: FYI - [facility] update  Phone Note From Other Clinic   Caller: [facility] - Therapist [phone] Summary of Call: Pt's bp supine 119/62 standing 100/60. Pt has some c/o lightheadedness & dizzyness upon standing. She reported that getting around the home today was easier w/less symptoms.  Initial call taken by: Lamar Sprinkles, CMA,  October 25, 2009 1:47 PM  Follow-up for Phone Call        thans for the info. Glad she is doing better. will continue present dose of fludrocortisone.  Follow-up by: Jacques Navy MD,  October 26, 2009 12:49 PM

## 2010-03-13 NOTE — Progress Notes (Signed)
  Phone Note Call from Patient   Summary of Call: Nurse from [phone])  called stating that pt is having memory changes, nauseau, and vomitting. Due to memory change pt forgot about her appt on  Monday. Nurse would like a call back. Initial call taken by: Josph Macho CMA,  March 02, 2009 11:30 AM  Follow-up for Phone Call        Trula Ore called from out pt rehab. she states pt had an episode of nausea and vomitting wens when she was there. They also reported some memory changes. Christina call as just and FYI Follow-up by: Ami Bullins CMA,  March 03, 2009 9:43 AM

## 2010-03-15 ENCOUNTER — Encounter: Payer: Self-pay | Admitting: Internal Medicine

## 2010-03-15 NOTE — Assessment & Plan Note (Signed)
Summary: vomiting on & off/#/cd   Vital Signs:  Patient profile:   60 year old female Height:      65 inches Weight:      196 pounds BMI:     32.73 O2 Sat:      97 % on Room air Temp:     97.2 degrees F oral Pulse rate:   53 / minute BP supine:   136 / 80  (right arm) BP sitting:   132 / 78  (right arm) BP standing:   132 / 70  (right arm) Cuff size:   regular  Vitals Entered By: Bill Salinas CMA (January 25, 2010 3:24 PM)  O2 Flow:  Room air CC: vomiting off and on x 2 days/ ab   Primary Care Lamona Eimer:  Jacques Navy MD  CC:  vomiting off and on x 2 days/ ab.  History of Present Illness: Patient presents due to 2 days of nausea and vomiting. She had rapid on-set of vomiting. No fever or chills, no abdominal pain, no diarrhea. she hasn't had any sickness. The emesis is without blood or coffee-grounds. She has not taken phenergan. She has been taking maalox.   Current Medications (verified): 1)  Carvedilol 25 Mg  Tabs (Carvedilol) .... 1/2 By Mouth Two Times A Day 2)  Lantus 100 Unit/ml  Soln (Insulin Glargine) .... 25 Units Every A.m. 3)  Simvastatin 20 Mg  Tabs (Simvastatin) .... Take 1 Tablet By Mouth Once A Day 4)  Plavix 75 Mg  Tabs (Clopidogrel Bisulfate) .... Once Daily 5)  Aspirin 81 Mg  Tabs (Aspirin) .... As Needed 6)  Levothroid 75 Mcg Tabs (Levothyroxine Sodium) .... Take 1 Tablet By Mouth Once A Day 7)  Tramadol Hcl 50 Mg Tabs (Tramadol Hcl) .Marland Kitchen.. 1 or 2 Three Times A Day As Needed For Pain 8)  Promethazine Hcl 12.5 Mg Tabs (Promethazine Hcl) .Marland Kitchen.. 1 Tab Q 6 Hours As Needed 9)  Glucerna  Liqd (Nutritional Supplements) .Marland Kitchen.. 1 Can Three Times A Day 10)  Furosemide 40 Mg Tabs (Furosemide) .Marland Kitchen.. 1 Tab Once Daily 11)  Potassium Chloride 20 Meq Pack (Potassium Chloride) .Marland Kitchen.. 1 Once Daily 12)  Sertraline Hcl 100 Mg Tabs (Sertraline Hcl) .Marland Kitchen.. 1 Tab Once Daily 13)  Amlodipine Besylate 2.5 Mg Tabs (Amlodipine Besylate) .Marland Kitchen.. 1 Tab Once Daily 14)  Acetaminophen 325 Mg Tabs  (Acetaminophen) .... 2 Tabs Q 4 Hours As Needed 15)  Biscolax 10 Mg Supp (Bisacodyl) .Marland Kitchen.. 1 Rectaly As Needed 16)  Halcion 0.25 Mg Tabs (Triazolam) .Marland Kitchen.. 1 At Bedtime As Needed 17)  Cyanocobalamin 1000 Mcg/ml Soln (Cyanocobalamin) .Marland Kitchen.. 1 Injection Monthly 18)  Fludrocortisone Acetate 0.1 Mg Tabs (Fludrocortisone Acetate) .... 2 Two Times A Day 19)  Loratadine 10 Mg Tabs (Loratadine) .Marland Kitchen.. 1 Tab Once Daily 20)  Folic Acid 1 Mg Tabs (Folic Acid) .Marland Kitchen.. 1 Once Daily  Allergies (verified): No Known Drug Allergies  Past History:  Past Medical History: Last updated: 04/17/2007 Coronary artery disease (right) Depression Diabetes mellitus, type II Hyperlipidemia Hypertension Cerebrovascular accident, hx of blind right eye - DM  Past Surgical History: Last updated: 07/28/2008 Appendectomy Caesarean section Cholecystectomy Endarterectomy Lumbar Diskectomy retinal detachment repair - "gas bubble"  June '10 FH reviewed for relevance, SH/Risk Factors reviewed for relevance  Review of Systems       The patient complains of weight loss, abdominal pain, and depression.  The patient denies anorexia, fever, vision loss, decreased hearing, dyspnea on exertion, peripheral edema, headaches, melena, hematochezia, severe  indigestion/heartburn, incontinence, muscle weakness, abnormal bleeding, and enlarged lymph nodes.    Physical Exam  General:  overweight white  woman in no acute distress Head:  normocephalic and atraumatic.   Eyes:  pupils equal and pupils round.  C&S clear. left lid droop. visual acuity not tested Neck:  supple.   Lungs:  normal respiratory effort.   Heart:  normal rate and regular rhythm.   Abdomen:  obese, soft, BS+ x 4, no guarding or rebound, no masses, mild diffuse tenderness Pulses:  2+ radial Neurologic:  alert & oriented X3, cranial nerves II-XII intact, strength normal in all extremities, and gait normal.   Skin:  turgor normal and color normal.   Psych:  Oriented  X3, normally interactive, good eye contact, and not anxious appearing.     Impression & Recommendations:  Problem # 1:  NAUSEA AND VOMITING (ICD-787.01) Most likely viral gastroenteritis. This is not like her previous h/o gastroparesis.  Plan - supportive  care: fluids of choice, maalox, etc if it helps           promethazine as needed           call for worsening symptoms or inability to keep down fluids.  Complete Medication List: 1)  Carvedilol 25 Mg Tabs (Carvedilol) .... 1/2 by mouth two times a day 2)  Lantus 100 Unit/ml Soln (Insulin glargine) .... 25 units every a.m. 3)  Simvastatin 20 Mg Tabs (Simvastatin) .... Take 1 tablet by mouth once a day 4)  Plavix 75 Mg Tabs (Clopidogrel bisulfate) .... Once daily 5)  Aspirin 81 Mg Tabs (Aspirin) .... As needed 6)  Levothroid 75 Mcg Tabs (Levothyroxine sodium) .... Take 1 tablet by mouth once a day 7)  Tramadol Hcl 50 Mg Tabs (Tramadol hcl) .Marland Kitchen.. 1 or 2 three times a day as needed for pain 8)  Promethazine Hcl 12.5 Mg Tabs (Promethazine hcl) .Marland Kitchen.. 1 tab q 6 hours as needed 9)  Glucerna Liqd (Nutritional supplements) .Marland Kitchen.. 1 can three times a day 10)  Furosemide 40 Mg Tabs (Furosemide) .Marland Kitchen.. 1 tab once daily 11)  Potassium Chloride 20 Meq Pack (Potassium chloride) .Marland Kitchen.. 1 once daily 12)  Sertraline Hcl 100 Mg Tabs (Sertraline hcl) .Marland Kitchen.. 1 tab once daily 13)  Amlodipine Besylate 2.5 Mg Tabs (Amlodipine besylate) .Marland Kitchen.. 1 tab once daily 14)  Acetaminophen 325 Mg Tabs (Acetaminophen) .... 2 tabs q 4 hours as needed 15)  Biscolax 10 Mg Supp (Bisacodyl) .Marland Kitchen.. 1 rectaly as needed 16)  Halcion 0.25 Mg Tabs (Triazolam) .Marland Kitchen.. 1 at bedtime as needed 17)  Cyanocobalamin 1000 Mcg/ml Soln (Cyanocobalamin) .Marland Kitchen.. 1 injection monthly 18)  Fludrocortisone Acetate 0.1 Mg Tabs (Fludrocortisone acetate) .... 2 two times a day 19)  Loratadine 10 Mg Tabs (Loratadine) .Marland Kitchen.. 1 tab once daily 20)  Folic Acid 1 Mg Tabs (Folic acid) .Marland Kitchen.. 1 once daily   Orders Added: 1)   Est. Patient Level III [45409]

## 2010-03-15 NOTE — Assessment & Plan Note (Signed)
Summary: b12 [name] [name]   Nurse Visit   Allergies: No Known Drug Allergies  Medication Administration  Injection # 1:    Medication: Vit B12 1000 mcg    Diagnosis: B12 DEFICIENCY (ICD-266.2)    Route: IM    Site: L deltoid    Exp Date: 09/2010    Lot #: [id number]    Mfr: [employer]    Comments: pt brought her own med    Patient tolerated injection without complications    Given by: Lamar Sprinkles, CMA (February 02, 2010 5:17 PM)  Orders Added: 1)  Admin of patients own med IM/SQ [96372M]   Medication Administration  Injection # 1:    Medication: Vit B12 1000 mcg    Diagnosis: B12 DEFICIENCY (ICD-266.2)    Route: IM    Site: L deltoid    Exp Date: 09/2010    Lot #: [id number]    Mfr: [employer]ent    Comments: pt brought her own med    Patient tolerated injection without complications    Given by: Lamar Sprinkles, CMA (February 02, 2010 5:17 PM)  Orders Added: 1)  Admin of patients own med IM/SQ [phone]

## 2010-03-15 NOTE — Letter (Signed)
Summary: Generic Letter  [facility]  [address] [place], [place] [address]   Phone: [phone]  Fax: [phone]             03/05/2010  To whom It May Concern  Re: [name]     DOB 01-31-1951     MRN # [id number]  Dear Milford Cage,  Attached is a copy of the patient's most recent hospital discharge which delineates a complex evaluation. This evaluation included a 2D echo which revealed no evidence of a cardiomyopathy with an essentially normal ejection fraction.  [name] has a complex set of medical problems which in return require a complex set of medications. This is also delineated in the hospital discharg summary.   If additional information is required please contact us about release of additional information and the incumbent charge(s) for additional record handling.  Sincerely,   Illene Regulus MD

## 2010-03-15 NOTE — Progress Notes (Signed)
Summary: [employer] co req  Phone Note Other Incoming   Caller: Ted at [employer] [phone] Summary of Call: [employer] called stating per pt medical Insurance company's review of medications they believe pt is being treated for Cardiomyopathy, pt denies this. Insurance company is requesting on pt's behalf a letter stating pt has not been Dx nor is she being Tx for this. They are also requesting letter to state the reasons pt is being treated with current medications. Felicity Pellegrini is requesting letter be faxed to [phone] [employer] (policy [phone]). Please advise.  Caller did not give the name of [employer] Initial call taken by: Margaret Pyle, CMA,  March 05, 2010 10:36 AM  Follow-up for Phone Call        dpo we have a HIPPA release to be able to send the insurance company this information??  If we have a release please send copy of the last hospital d/c summary and the letter under documents. Follow-up by: Jacques Navy MD,  March 05, 2010 1:09 PM  Additional Follow-up for Phone Call Additional follow up Details #1::        Pt was seen for nurse visit. She signed release and info to be faxed today Additional Follow-up by: Lamar Sprinkles, CMA,  March 05, 2010 3:05 PM

## 2010-03-21 NOTE — Progress Notes (Signed)
Summary: RFs  Phone Note Refill Request   Refills Requested: Medication #1:  FUROSEMIDE 40 MG TABS 1 tab once daily  Medication #2:  SERTRALINE HCL 100 MG TABS 1 tab once daily  Medication #3:  PLAVIX 75 MG  TABS once daily  Medication #4:  SIMVASTATIN 20 MG  TABS Take 1 tablet by mouth once a day ALSO NEEDS:  Triazolam & levothyroid & carvedilol Ok for refills 90 day supply TO GO TO [facility]?  Initial call taken by: Lamar Sprinkles, CMA,  March 12, 2010 3:43 PM  Follow-up for Phone Call        ok on all refills for 90 days x 3 Follow-up by: Jacques Navy MD,  March 15, 2010 4:59 AM  Additional Follow-up for Phone Call Additional follow up Details #1::        Rx's completed - pt's husband aware Additional Follow-up by: Lamar Sprinkles, CMA,  March 15, 2010 11:32 AM    Prescriptions: HALCION 0.25 MG TABS (TRIAZOLAM) 1 at bedtime as needed  #90 x 1   Entered by:   Lamar Sprinkles, CMA   Authorized by:   Jacques Navy MD   Signed by:   Lamar Sprinkles, CMA on 03/15/2010   Method used:   Printed then faxed to ...       [facility]       [address] [place], [place]  [address]       Ph: [phone]       Fax: [phone]   RxID:   [id number] LEVOTHROID 75 MCG TABS (LEVOTHYROXINE SODIUM) Take 1 tablet by mouth once a day  #90 x 3   Entered by:   Lamar Sprinkles, CMA   Authorized by:   Jacques Navy MD   Signed by:   Lamar Sprinkles, CMA on 03/15/2010   Method used:   Faxed to ...       [facility] (mail[facility]order)       [address] [place], [place]  [address]       Ph: [phone]       Fax: [phone]   RxID:   [id number] CARVEDILOL 25 MG  TABS (CARVEDILOL) 1/2 by mouth two times a day  #90 x 3   Entered by:   Lamar Sprinkles, CMA   Authorized by:   Jacques Navy MD   Signed by:   Lamar Sprinkles, CMA on 03/15/2010   Method used:   Faxed to ...       [facility] (mail[facility]order)       [address] [place], [place]   [address]       Ph: [phone]       Fax: [phone]   RxID:   [id number] SERTRALINE HCL 100 MG TABS (SERTRALINE HCL) 1 tab once daily  #90 x 3   Entered by:   Lamar Sprinkles, CMA   Authorized by:   Jacques Navy MD   Signed by:   Lamar Sprinkles, CMA on 03/15/2010   Method used:   Faxed to ...       [facility] (mail[facility]order)       [address] [place], [place]  [address]       Ph: [phone]       Fax: [phone]   RxID:   [id number] FUROSEMIDE [phone]  MG TABS (FUROSEMIDE) 1 tab once daily  #90 x 3   Entered by:   Lamar Sprinkles, CMA   Authorized by:   Jacques Navy MD   Signed by:   Lamar Sprinkles, CMA on 03/15/2010   Method used:   Faxed to ...       [facility] (mail-order)       [address] [place], [place]  [address]       Ph: [phone]       Fax: [phone]   RxID:   [id number] PLAVIX 75 MG  TABS (CLOPIDOGREL BISULFATE) once daily  #90 x 3   Entered by:   Lamar Sprinkles, CMA   Authorized by:   Jacques Navy MD   Signed by:   Lamar Sprinkles, CMA on 03/15/2010   Method used:   Faxed to ...       [facility] (mail-order)       [address] [place], [place]  [address]       Ph: [phone]       Fax: [phone]   RxID:   [id number] SIMVASTATIN 20 MG  TABS (SIMVASTATIN) Take 1 tablet by mouth once a day  #90 x 3   Entered by:   Lamar Sprinkles, CMA   Authorized by:   Jacques Navy MD   Signed by:   Lamar Sprinkles, CMA on 03/15/2010   Method used:   Faxed to ...       [facility] (mail[facility]order)       [address] [place], [place]  [address]       Ph: [phone]       Fax: [phone]   RxID:   [id number]

## 2010-03-26 ENCOUNTER — Encounter: Payer: Self-pay | Admitting: Internal Medicine

## 2010-03-29 NOTE — Assessment & Plan Note (Signed)
Summary: b12 [name] [name]   Nurse Visit   Allergies: No Known Drug Allergies  Medication Administration  Injection # 1:    Medication: Vit B12 1000 mcg    Diagnosis: B12 DEFICIENCY (ICD-266.2)    Route: IM    Site: R deltoid    Exp Date: 09/12/2010    Lot #: [id number]    Mfr: American Regent    Patient tolerated injection without complications    Given by: Lamar Sprinkles, CMA (March 20, 2010 5:58 PM)  Orders Added: 1)  Vit B12 1000 mcg [J3420] 2)  Admin of Therapeutic Inj  intramuscular or subcutaneous [96372]   Medication Administration  Injection # 1:    Medication: Vit B12 1000 mcg    Diagnosis: B12 DEFICIENCY (ICD-266.2)    Route: IM    Site: R deltoid    Exp Date: 09/12/2010    Lot #: [id number]    Mfr: American Regent    Patient tolerated injection without complications    Given by: Lamar Sprinkles, CMA (March 20, 2010 5:58 PM)  Orders Added: 1)  Vit B12 1000 mcg [J3420] 2)  Admin of Therapeutic Inj  intramuscular or subcutaneous [[id number]]

## 2010-03-29 NOTE — Medication Information (Signed)
[facility]   Imported By: Lester [place] 03/21/2010 08:01:05  _____________________________________________________________________  External Attachment:    Type:   Image     Comment:   External Document

## 2010-04-02 ENCOUNTER — Encounter: Payer: Self-pay | Admitting: Internal Medicine

## 2010-04-04 ENCOUNTER — Encounter: Payer: Self-pay | Admitting: Internal Medicine

## 2010-04-05 ENCOUNTER — Ambulatory Visit (INDEPENDENT_AMBULATORY_CARE_PROVIDER_SITE_OTHER): Payer: Medicare Other

## 2010-04-05 ENCOUNTER — Encounter: Payer: Self-pay | Admitting: Internal Medicine

## 2010-04-05 DIAGNOSIS — E538 Deficiency of other specified B group vitamins: Secondary | ICD-10-CM

## 2010-04-10 NOTE — Assessment & Plan Note (Signed)
Summary: b12/norins/lb   Nurse Visit   Allergies: No Known Drug Allergies  Medication Administration  Injection # 1:    Medication: Vit B12 1000 mcg    Diagnosis: B12 DEFICIENCY (ICD-266.2)    Route: IM    Site: L deltoid    Exp Date: 09/2010    Lot #: [id number]    Mfr: American Regent  Orders Added: 1)  Admin of patients own med IM/SQ [11914N]

## 2010-04-13 ENCOUNTER — Encounter (HOSPITAL_COMMUNITY): Payer: Medicare Other

## 2010-04-18 DIAGNOSIS — Z0279 Encounter for issue of other medical certificate: Secondary | ICD-10-CM

## 2010-04-19 ENCOUNTER — Encounter (HOSPITAL_COMMUNITY): Payer: Medicare Other | Attending: Nephrology

## 2010-04-19 ENCOUNTER — Other Ambulatory Visit: Payer: Self-pay | Admitting: Nephrology

## 2010-04-19 DIAGNOSIS — I129 Hypertensive chronic kidney disease with stage 1 through stage 4 chronic kidney disease, or unspecified chronic kidney disease: Secondary | ICD-10-CM | POA: Insufficient documentation

## 2010-04-19 DIAGNOSIS — N182 Chronic kidney disease, stage 2 (mild): Secondary | ICD-10-CM | POA: Insufficient documentation

## 2010-04-19 LAB — RENAL FUNCTION PANEL
Albumin: 3.4 g/dL — ABNORMAL LOW (ref 3.5–5.2)
BUN: 21 mg/dL (ref 6–23)
Calcium: 9.7 mg/dL (ref 8.4–10.5)
Phosphorus: 3.9 mg/dL (ref 2.3–4.6)
Potassium: 4.1 mEq/L (ref 3.5–5.1)
Sodium: 140 mEq/L (ref 135–145)

## 2010-04-19 LAB — HEMOGLOBIN AND HEMATOCRIT, BLOOD
HCT: 25.3 % — ABNORMAL LOW (ref 36.0–46.0)
Hemoglobin: 8.8 g/dL — ABNORMAL LOW (ref 12.0–15.0)

## 2010-04-27 ENCOUNTER — Telehealth: Payer: Self-pay | Admitting: Internal Medicine

## 2010-04-27 LAB — BASIC METABOLIC PANEL
BUN: 28 mg/dL — ABNORMAL HIGH (ref 6–23)
BUN: 29 mg/dL — ABNORMAL HIGH (ref 6–23)
BUN: 20 mg/dL (ref 6–23)
CO2: 28 mEq/L (ref 19–32)
Calcium: 10.2 mg/dL (ref 8.4–10.5)
Calcium: 10.1 mg/dL (ref 8.4–10.5)
Calcium: 10.2 mg/dL (ref 8.4–10.5)
Chloride: 101 mEq/L (ref 96–112)
Creatinine, Ser: 3.66 mg/dL — ABNORMAL HIGH (ref 0.4–1.2)
Creatinine, Ser: 3.59 mg/dL — ABNORMAL HIGH (ref 0.4–1.2)
Creatinine, Ser: 3.89 mg/dL — ABNORMAL HIGH (ref 0.4–1.2)
GFR calc Af Amer: 14 mL/min — ABNORMAL LOW (ref >60)
GFR calc non Af Amer: 12 mL/min — ABNORMAL LOW (ref >60)
GFR calc non Af Amer: 13 mL/min — ABNORMAL LOW (ref >60)
GFR calc non Af Amer: 12 mL/min — ABNORMAL LOW (ref >60)
GFR calc non Af Amer: 13 mL/min — ABNORMAL LOW (ref >60)
GFR calc non Af Amer: 13 mL/min — ABNORMAL LOW (ref >60)
Glucose, Bld: 148 mg/dL — ABNORMAL HIGH (ref 70–99)
Glucose, Bld: 150 mg/dL — ABNORMAL HIGH (ref 70–99)
Glucose, Bld: 174 mg/dL — ABNORMAL HIGH (ref 70–99)
Potassium: 3.2 mEq/L — ABNORMAL LOW (ref 3.5–5.1)
Potassium: 4.4 mEq/L (ref 3.5–5.1)
Potassium: 4.8 mEq/L (ref 3.5–5.1)
Sodium: 143 mEq/L (ref 135–145)

## 2010-04-27 LAB — FOLATE: Folate: 4.6 ng/mL

## 2010-04-27 LAB — COMPREHENSIVE METABOLIC PANEL
ALT: 9 U/L (ref 0–35)
AST: 34 U/L (ref 0–37)
AST: 16 U/L (ref 0–37)
Albumin: 3.3 g/dL — ABNORMAL LOW (ref 3.5–5.2)
Alkaline Phosphatase: 51 U/L (ref 39–117)
Alkaline Phosphatase: 67 U/L (ref 39–117)
BUN: 30 mg/dL — ABNORMAL HIGH (ref 6–23)
BUN: 31 mg/dL — ABNORMAL HIGH (ref 6–23)
CO2: 33 mEq/L — ABNORMAL HIGH (ref 19–32)
Calcium: 10 mg/dL (ref 8.4–10.5)
Calcium: 9.7 mg/dL (ref 8.4–10.5)
Calcium: 9.9 mg/dL (ref 8.4–10.5)
Chloride: 104 mEq/L (ref 96–112)
Creatinine, Ser: 3.81 mg/dL — ABNORMAL HIGH (ref 0.4–1.2)
Creatinine, Ser: 3.98 mg/dL — ABNORMAL HIGH (ref 0.4–1.2)
GFR calc Af Amer: 14 mL/min — ABNORMAL LOW (ref >60)
GFR calc Af Amer: 15 mL/min — ABNORMAL LOW (ref >60)
GFR calc non Af Amer: 13 mL/min — ABNORMAL LOW (ref >60)
Glucose, Bld: 132 mg/dL — ABNORMAL HIGH (ref 70–99)
Potassium: 3 mEq/L — ABNORMAL LOW (ref 3.5–5.1)
Sodium: 140 mEq/L (ref 135–145)
Total Bilirubin: 0.8 mg/dL (ref 0.3–1.2)
Total Protein: 5.8 g/dL — ABNORMAL LOW (ref 6.0–8.3)

## 2010-04-27 LAB — CBC
HCT: 20.8 % — ABNORMAL LOW (ref 36.0–46.0)
HCT: 24.5 % — ABNORMAL LOW (ref 36.0–46.0)
HCT: 33.4 % — ABNORMAL LOW (ref 36.0–46.0)
HCT: 34.6 % — ABNORMAL LOW (ref 36.0–46.0)
HCT: 33.7 % — ABNORMAL LOW (ref 36.0–46.0)
HCT: 36.1 % (ref 36.0–46.0)
Hemoglobin: 11.3 g/dL — ABNORMAL LOW (ref 12.0–15.0)
MCH: 31.9 pg (ref 26.0–34.0)
MCH: 32.7 pg (ref 26.0–34.0)
MCHC: 34.4 g/dL (ref 30.0–36.0)
MCHC: 35 g/dL (ref 30.0–36.0)
MCHC: 35.1 g/dL (ref 30.0–36.0)
MCHC: 33.5 g/dL (ref 30.0–36.0)
MCHC: 34.2 g/dL (ref 30.0–36.0)
MCV: 91.8 fL (ref 78.0–100.0)
MCV: 93.2 fL (ref 78.0–100.0)
MCV: 95.2 fL (ref 78.0–100.0)
Platelets: 135 10*3/uL — ABNORMAL LOW (ref 150–400)
Platelets: 198 10*3/uL (ref 150–400)
Platelets: 269 10*3/uL (ref 150–400)
RBC: 3.31 MIL/uL — ABNORMAL LOW (ref 3.87–5.11)
RBC: 3.46 MIL/uL — ABNORMAL LOW (ref 3.87–5.11)
RDW: 12.7 % (ref 11.5–15.5)
RDW: 12.8 % (ref 11.5–15.5)
RDW: 13 % (ref 11.5–15.5)
RDW: 14.8 % (ref 11.5–15.5)
RDW: 15 % (ref 11.5–15.5)
RDW: 13.5 % (ref 11.5–15.5)
WBC: 21.2 10*3/uL — ABNORMAL HIGH (ref 4.0–10.5)
WBC: 8.3 10*3/uL (ref 4.0–10.5)
WBC: 9.1 10*3/uL (ref 4.0–10.5)

## 2010-04-27 LAB — GLUCOSE, CAPILLARY
Glucose-Capillary: 106 mg/dL — ABNORMAL HIGH (ref 70–99)
Glucose-Capillary: 112 mg/dL — ABNORMAL HIGH (ref 70–99)
Glucose-Capillary: 113 mg/dL — ABNORMAL HIGH (ref 70–99)
Glucose-Capillary: 117 mg/dL — ABNORMAL HIGH (ref 70–99)
Glucose-Capillary: 119 mg/dL — ABNORMAL HIGH (ref 70–99)
Glucose-Capillary: 121 mg/dL — ABNORMAL HIGH (ref 70–99)
Glucose-Capillary: 123 mg/dL — ABNORMAL HIGH (ref 70–99)
Glucose-Capillary: 129 mg/dL — ABNORMAL HIGH (ref 70–99)
Glucose-Capillary: 129 mg/dL — ABNORMAL HIGH (ref 70–99)
Glucose-Capillary: 151 mg/dL — ABNORMAL HIGH (ref 70–99)
Glucose-Capillary: 151 mg/dL — ABNORMAL HIGH (ref 70–99)
Glucose-Capillary: 156 mg/dL — ABNORMAL HIGH (ref 70–99)
Glucose-Capillary: 166 mg/dL — ABNORMAL HIGH (ref 70–99)
Glucose-Capillary: 173 mg/dL — ABNORMAL HIGH (ref 70–99)
Glucose-Capillary: 173 mg/dL — ABNORMAL HIGH (ref 70–99)
Glucose-Capillary: 178 mg/dL — ABNORMAL HIGH (ref 70–99)
Glucose-Capillary: 178 mg/dL — ABNORMAL HIGH (ref 70–99)
Glucose-Capillary: 261 mg/dL — ABNORMAL HIGH (ref 70–99)
Glucose-Capillary: 74 mg/dL (ref 70–99)
Glucose-Capillary: 101 mg/dL — ABNORMAL HIGH (ref 70–99)
Glucose-Capillary: 104 mg/dL — ABNORMAL HIGH (ref 70–99)
Glucose-Capillary: 104 mg/dL — ABNORMAL HIGH (ref 70–99)
Glucose-Capillary: 108 mg/dL — ABNORMAL HIGH (ref 70–99)
Glucose-Capillary: 112 mg/dL — ABNORMAL HIGH (ref 70–99)
Glucose-Capillary: 117 mg/dL — ABNORMAL HIGH (ref 70–99)
Glucose-Capillary: 122 mg/dL — ABNORMAL HIGH (ref 70–99)
Glucose-Capillary: 140 mg/dL — ABNORMAL HIGH (ref 70–99)
Glucose-Capillary: 140 mg/dL — ABNORMAL HIGH (ref 70–99)
Glucose-Capillary: 152 mg/dL — ABNORMAL HIGH (ref 70–99)
Glucose-Capillary: 158 mg/dL — ABNORMAL HIGH (ref 70–99)
Glucose-Capillary: 162 mg/dL — ABNORMAL HIGH (ref 70–99)
Glucose-Capillary: 177 mg/dL — ABNORMAL HIGH (ref 70–99)
Glucose-Capillary: 189 mg/dL — ABNORMAL HIGH (ref 70–99)
Glucose-Capillary: 206 mg/dL — ABNORMAL HIGH (ref 70–99)
Glucose-Capillary: 82 mg/dL (ref 70–99)
Glucose-Capillary: 84 mg/dL (ref 70–99)
Glucose-Capillary: 87 mg/dL (ref 70–99)

## 2010-04-27 LAB — MAGNESIUM: Magnesium: 1.7 mg/dL (ref 1.5–2.5)

## 2010-04-27 LAB — DIFFERENTIAL
Basophils Absolute: 0 10*3/uL (ref 0.0–0.1)
Basophils Relative: 0 % (ref 0–1)
Eosinophils Absolute: 0.2 10*3/uL (ref 0.0–0.7)
Eosinophils Relative: 1 % (ref 0–5)
Eosinophils Relative: 2 % (ref 0–5)
Lymphocytes Relative: 9 % — ABNORMAL LOW (ref 12–46)
Lymphs Abs: 1.9 10*3/uL (ref 0.7–4.0)
Lymphs Abs: 1.9 10*3/uL (ref 0.7–4.0)
Monocytes Relative: 6 % (ref 3–12)
Monocytes Relative: 10 % (ref 3–12)
Neutro Abs: 17.8 10*3/uL — ABNORMAL HIGH (ref 1.7–7.7)

## 2010-04-27 LAB — VITAMIN B12: Vitamin B-12: 210 pg/mL — ABNORMAL LOW (ref 211–911)

## 2010-04-27 LAB — TYPE AND SCREEN: ABO/RH(D): A POS

## 2010-04-27 LAB — ABO/RH: ABO/RH(D): A POS

## 2010-04-27 LAB — URINE CULTURE
Colony Count: NO GROWTH
Culture  Setup Time: 201108190221

## 2010-04-27 LAB — RETICULOCYTES
Retic Count, Absolute: 45.8 10*3/uL (ref 19.0–186.0)
Retic Ct Pct: 2.1 % (ref 0.4–3.1)

## 2010-04-27 LAB — CARDIAC PANEL(CRET KIN+CKTOT+MB+TROPI)
Relative Index: INVALID (ref 0.0–2.5)
Troponin I: 0.05 ng/mL (ref 0.00–0.06)
Troponin I: 0.11 ng/mL — ABNORMAL HIGH (ref 0.00–0.06)

## 2010-04-27 LAB — POCT I-STAT, CHEM 8
Calcium, Ion: 1.21 mmol/L (ref 1.12–1.32)
Glucose, Bld: 117 mg/dL — ABNORMAL HIGH (ref 70–99)
HCT: 32 % — ABNORMAL LOW (ref 36.0–46.0)
Hemoglobin: 10.9 g/dL — ABNORMAL LOW (ref 12.0–15.0)
Potassium: 3.5 mEq/L (ref 3.5–5.1)

## 2010-04-27 LAB — URINALYSIS, ROUTINE W REFLEX MICROSCOPIC
Hgb urine dipstick: NEGATIVE
Ketones, ur: NEGATIVE mg/dL
Protein, ur: 30 mg/dL — AB
Urobilinogen, UA: 0.2 mg/dL (ref 0.0–1.0)

## 2010-04-27 LAB — URINE MICROSCOPIC-ADD ON

## 2010-04-27 LAB — TROPONIN I: Troponin I: 0.04 ng/mL (ref 0.00–0.06)

## 2010-04-27 LAB — ERYTHROPOIETIN: Erythropoietin: 14.9 m[IU]/mL (ref 2.6–34.0)

## 2010-04-27 LAB — CORTISOL-AM, BLOOD: Cortisol - AM: 20.3 ug/dL (ref 4.3–22.4)

## 2010-04-27 LAB — CK TOTAL AND CKMB (NOT AT ARMC)
CK, MB: 1.9 ng/mL (ref 0.3–4.0)
Relative Index: INVALID (ref 0.0–2.5)
Total CK: 41 U/L (ref 7–177)

## 2010-04-27 LAB — CULTURE, BLOOD (ROUTINE X 2): Culture: NO GROWTH

## 2010-04-27 LAB — PREPARE RBC (CROSSMATCH)

## 2010-04-27 LAB — IRON AND TIBC: TIBC: 250 ug/dL (ref 250–470)

## 2010-04-27 LAB — AMMONIA: Ammonia: 24 umol/L (ref 11–35)

## 2010-04-27 LAB — PROTIME-INR: Prothrombin Time: 14.2 seconds (ref 11.6–15.2)

## 2010-04-27 LAB — HEMOCCULT GUIAC POC 1CARD (OFFICE)
Fecal Occult Bld: NEGATIVE
Fecal Occult Bld: NEGATIVE

## 2010-04-28 LAB — TSH: TSH: 0.961 u[IU]/mL (ref 0.350–4.500)

## 2010-04-28 LAB — HEPATITIS B SURFACE ANTIGEN: Hepatitis B Surface Ag: NEGATIVE

## 2010-04-28 LAB — CBC
Hemoglobin: 11.6 g/dL — ABNORMAL LOW (ref 12.0–15.0)
RBC: 3.36 MIL/uL — ABNORMAL LOW (ref 3.87–5.11)
WBC: 9.2 10*3/uL (ref 4.0–10.5)

## 2010-04-28 LAB — GLUCOSE, CAPILLARY
Glucose-Capillary: 127 mg/dL — ABNORMAL HIGH (ref 70–99)
Glucose-Capillary: 136 mg/dL — ABNORMAL HIGH (ref 70–99)
Glucose-Capillary: 178 mg/dL — ABNORMAL HIGH (ref 70–99)
Glucose-Capillary: 187 mg/dL — ABNORMAL HIGH (ref 70–99)
Glucose-Capillary: 43 mg/dL — CL (ref 70–99)
Glucose-Capillary: 48 mg/dL — ABNORMAL LOW (ref 70–99)
Glucose-Capillary: 48 mg/dL — ABNORMAL LOW (ref 70–99)
Glucose-Capillary: 51 mg/dL — ABNORMAL LOW (ref 70–99)
Glucose-Capillary: 60 mg/dL — ABNORMAL LOW (ref 70–99)
Glucose-Capillary: 79 mg/dL (ref 70–99)
Glucose-Capillary: 86 mg/dL (ref 70–99)
Glucose-Capillary: 92 mg/dL (ref 70–99)

## 2010-04-28 LAB — CARDIAC PANEL(CRET KIN+CKTOT+MB+TROPI)
CK, MB: 1.6 ng/mL (ref 0.3–4.0)
Total CK: 59 U/L (ref 7–177)
Total CK: 84 U/L (ref 7–177)
Troponin I: 0.04 ng/mL (ref 0.00–0.06)

## 2010-04-28 LAB — COMPREHENSIVE METABOLIC PANEL
Alkaline Phosphatase: 72 U/L (ref 39–117)
BUN: 15 mg/dL (ref 6–23)
CO2: 31 mEq/L (ref 19–32)
Calcium: 10.1 mg/dL (ref 8.4–10.5)
GFR calc non Af Amer: 13 mL/min — ABNORMAL LOW (ref >60)
Glucose, Bld: 56 mg/dL — ABNORMAL LOW (ref 70–99)
Total Protein: 6.6 g/dL (ref 6.0–8.3)

## 2010-04-28 LAB — LIPID PANEL
Cholesterol: 151 mg/dL (ref 0–200)
LDL Cholesterol: 86 mg/dL (ref 0–99)
Triglycerides: 114 mg/dL (ref <150)
VLDL: 23 mg/dL (ref 0–40)

## 2010-04-28 LAB — APTT: aPTT: 26 seconds (ref 24–37)

## 2010-04-28 LAB — PROTIME-INR
INR: 1.13 (ref 0.00–1.49)
Prothrombin Time: 14.4 seconds (ref 11.6–15.2)

## 2010-04-28 LAB — HEMOGLOBIN A1C: Hgb A1c MFr Bld: 5.3 % (ref <5.7)

## 2010-05-01 NOTE — Progress Notes (Signed)
  Phone Note Refill Request Message from:  Patient on April 27, 2010 2:13 PM  Refills Requested: Medication #1:  PLAVIX 75 MG  TABS once daily will call in 30 day supply until pt gets mail order  Initial call taken by: Ami Bullins CMA,  April 27, 2010 2:13 PM    Prescriptions: PLAVIX 75 MG  TABS (CLOPIDOGREL BISULFATE) once daily  #30 x 0   Entered by:   Ami Bullins CMA   Authorized by:   Jacques Navy MD   Signed by:   Bill Salinas CMA on 04/27/2010   Method used:   [id number]       [address]       [place], [place]  [address]       Ph: [phone] or [phone]       Fax: [phone]   RxID:   [id number]

## 2010-05-04 ENCOUNTER — Ambulatory Visit (INDEPENDENT_AMBULATORY_CARE_PROVIDER_SITE_OTHER): Payer: Medicare Other | Admitting: *Deleted

## 2010-05-04 DIAGNOSIS — E538 Deficiency of other specified B group vitamins: Secondary | ICD-10-CM

## 2010-05-04 MED ORDER — CYANOCOBALAMIN 1000 MCG/ML IJ SOLN
1000.0000 ug | Freq: Once | INTRAMUSCULAR | Status: AC
  Administered 2010-05-04: 1000 ug via INTRAMUSCULAR

## 2010-05-07 LAB — BASIC METABOLIC PANEL
BUN: 20 mg/dL (ref 6–23)
CO2: 26 mEq/L (ref 19–32)
CO2: 28 mEq/L (ref 19–32)
Calcium: 9.5 mg/dL (ref 8.4–10.5)
Chloride: 110 mEq/L (ref 96–112)
Chloride: 112 mEq/L (ref 96–112)
Creatinine, Ser: 3.13 mg/dL — ABNORMAL HIGH (ref 0.4–1.2)
Creatinine, Ser: 3.24 mg/dL — ABNORMAL HIGH (ref 0.4–1.2)
GFR calc Af Amer: 18 mL/min — ABNORMAL LOW (ref >60)
GFR calc Af Amer: 18 mL/min — ABNORMAL LOW (ref >60)
GFR calc non Af Amer: 15 mL/min — ABNORMAL LOW (ref >60)
Glucose, Bld: 76 mg/dL (ref 70–99)
Potassium: 4 mEq/L (ref 3.5–5.1)
Sodium: 143 mEq/L (ref 135–145)
Sodium: 146 mEq/L — ABNORMAL HIGH (ref 135–145)

## 2010-05-07 LAB — POCT I-STAT, CHEM 8
BUN: 26 mg/dL — ABNORMAL HIGH (ref 6–23)
Creatinine, Ser: 3.6 mg/dL — ABNORMAL HIGH (ref 0.4–1.2)
Hemoglobin: 11.2 g/dL — ABNORMAL LOW (ref 12.0–15.0)
Potassium: 3 mEq/L — ABNORMAL LOW (ref 3.5–5.1)
Sodium: 143 mEq/L (ref 135–145)
TCO2: 33 mmol/L (ref 0–100)

## 2010-05-07 LAB — URINALYSIS, ROUTINE W REFLEX MICROSCOPIC
Bilirubin Urine: NEGATIVE
Glucose, UA: NEGATIVE mg/dL
Hgb urine dipstick: NEGATIVE
Specific Gravity, Urine: 1.013 (ref 1.005–1.030)
pH: 5 (ref 5.0–8.0)

## 2010-05-07 LAB — COMPREHENSIVE METABOLIC PANEL
AST: 22 U/L (ref 0–37)
CO2: 32 mEq/L (ref 19–32)
Calcium: 9.3 mg/dL (ref 8.4–10.5)
Creatinine, Ser: 3.6 mg/dL — ABNORMAL HIGH (ref 0.4–1.2)
GFR calc Af Amer: 16 mL/min — ABNORMAL LOW (ref >60)
GFR calc non Af Amer: 13 mL/min — ABNORMAL LOW (ref >60)
Total Protein: 6 g/dL (ref 6.0–8.3)

## 2010-05-07 LAB — GLUCOSE, CAPILLARY
Glucose-Capillary: 106 mg/dL — ABNORMAL HIGH (ref 70–99)
Glucose-Capillary: 107 mg/dL — ABNORMAL HIGH (ref 70–99)
Glucose-Capillary: 114 mg/dL — ABNORMAL HIGH (ref 70–99)
Glucose-Capillary: 128 mg/dL — ABNORMAL HIGH (ref 70–99)
Glucose-Capillary: 136 mg/dL — ABNORMAL HIGH (ref 70–99)
Glucose-Capillary: 52 mg/dL — ABNORMAL LOW (ref 70–99)
Glucose-Capillary: 60 mg/dL — ABNORMAL LOW (ref 70–99)
Glucose-Capillary: 68 mg/dL — ABNORMAL LOW (ref 70–99)
Glucose-Capillary: 78 mg/dL (ref 70–99)

## 2010-05-07 LAB — PHOSPHORUS: Phosphorus: 3.1 mg/dL (ref 2.3–4.6)

## 2010-05-07 LAB — DIFFERENTIAL
Basophils Absolute: 0 10*3/uL (ref 0.0–0.1)
Eosinophils Absolute: 0.1 10*3/uL (ref 0.0–0.7)
Eosinophils Relative: 1 % (ref 0–5)
Lymphocytes Relative: 22 % (ref 12–46)
Lymphs Abs: 1.7 10*3/uL (ref 0.7–4.0)
Neutrophils Relative %: 71 % (ref 43–77)

## 2010-05-07 LAB — URINE MICROSCOPIC-ADD ON

## 2010-05-07 LAB — CBC
Platelets: 195 10*3/uL (ref 150–400)
RDW: 13.1 % (ref 11.5–15.5)
WBC: 7.9 10*3/uL (ref 4.0–10.5)

## 2010-05-07 LAB — VITAMIN B12: Vitamin B-12: 147 pg/mL — ABNORMAL LOW (ref 211–911)

## 2010-05-07 LAB — MAGNESIUM: Magnesium: 1.7 mg/dL (ref 1.5–2.5)

## 2010-05-10 NOTE — Letter (Signed)
Summary: [facility]  [facility]   Imported By: Sherian Rein 04/30/2010 09:31:00  _____________________________________________________________________  External Attachment:    Type:   Image     Comment:   External Document

## 2010-05-18 LAB — COMPREHENSIVE METABOLIC PANEL
ALT: 16 U/L (ref 0–35)
AST: 27 U/L (ref 0–37)
Alkaline Phosphatase: 50 U/L (ref 39–117)
Alkaline Phosphatase: 51 U/L (ref 39–117)
BUN: 39 mg/dL — ABNORMAL HIGH (ref 6–23)
CO2: 29 mEq/L (ref 19–32)
CO2: 30 mEq/L (ref 19–32)
Chloride: 106 mEq/L (ref 96–112)
Creatinine, Ser: 2 mg/dL — ABNORMAL HIGH (ref 0.4–1.2)
GFR calc Af Amer: 38 mL/min — ABNORMAL LOW (ref >60)
GFR calc non Af Amer: 25 mL/min — ABNORMAL LOW (ref >60)
Glucose, Bld: 80 mg/dL (ref 70–99)
Potassium: 3.4 mEq/L — ABNORMAL LOW (ref 3.5–5.1)
Potassium: 3.4 mEq/L — ABNORMAL LOW (ref 3.5–5.1)
Sodium: 141 mEq/L (ref 135–145)
Total Bilirubin: 0.7 mg/dL (ref 0.3–1.2)
Total Protein: 6.8 g/dL (ref 6.0–8.3)

## 2010-05-18 LAB — URINE CULTURE
Colony Count: NO GROWTH
Culture: NO GROWTH

## 2010-05-18 LAB — CBC
HCT: 38.1 % (ref 36.0–46.0)
Hemoglobin: 13.3 g/dL (ref 12.0–15.0)
MCV: 94.4 fL (ref 78.0–100.0)
RBC: 4.04 MIL/uL (ref 3.87–5.11)
WBC: 6.3 10*3/uL (ref 4.0–10.5)

## 2010-05-18 LAB — GLUCOSE, CAPILLARY
Glucose-Capillary: 133 mg/dL — ABNORMAL HIGH (ref 70–99)
Glucose-Capillary: 156 mg/dL — ABNORMAL HIGH (ref 70–99)
Glucose-Capillary: 89 mg/dL (ref 70–99)

## 2010-06-01 ENCOUNTER — Ambulatory Visit (INDEPENDENT_AMBULATORY_CARE_PROVIDER_SITE_OTHER): Payer: Medicare Other | Admitting: *Deleted

## 2010-06-01 DIAGNOSIS — E538 Deficiency of other specified B group vitamins: Secondary | ICD-10-CM

## 2010-06-01 MED ORDER — CYANOCOBALAMIN 1000 MCG/ML IJ SOLN: 1000.0000 ug | Freq: Once | INTRAMUSCULAR | Status: DC

## 2010-06-01 MED ORDER — CYANOCOBALAMIN 1000 MCG/ML IJ SOLN
1000.0000 ug | Freq: Once | INTRAMUSCULAR | Status: AC
  Administered 2010-06-01: 1000 ug via INTRAMUSCULAR

## 2010-06-18 ENCOUNTER — Other Ambulatory Visit: Payer: Self-pay | Admitting: Internal Medicine

## 2010-06-19 ENCOUNTER — Encounter: Payer: Self-pay | Admitting: Internal Medicine

## 2010-06-27 ENCOUNTER — Telehealth: Payer: Self-pay | Admitting: *Deleted

## 2010-06-27 MED ORDER — INSULIN GLARGINE 100 UNIT/ML ~~LOC~~ SOLN: 70.0000 [IU] | Freq: Every day | SUBCUTANEOUS | Status: DC

## 2010-06-27 NOTE — Telephone Encounter (Signed)
refill 

## 2010-06-29 NOTE — Op Note (Signed)
[place]  Patient:    [name], [name] Visit Number: [id number] MRN: [id number]          Service Type: DSU Location: [id number] Attending Physician:  Ivor Messier Dictated by:   Guadelupe Sabin, M.D. Proc. Date: 02/13/01 Admit Date:  02/13/2001 Discharge Date: 02/14/2001   CC:         [name], M.D. [facility]  Dr. Cherly Hensen ([facility])   Operative Report  PREOPERATIVE DIAGNOSES: 1. Acute and chronic vitreous hemorrhage, right eye. 2. Proliferative diabetic retinopathy, right eye.  POSTOPERATIVE DIAGNOSES: 1. Acute and chronic vitreous hemorrhage, right eye. 2. Proliferative diabetic retinopathy, right eye.  NAME OF OPERATION:  Pars plana vitrectomy using vitreous infusion suction cutter, preretinal membrane peeling and excision, and Endolaser photocoagulation.  SURGEON:  Guadelupe Sabin, M.D.  ASSISTANT:  Nurse.  ANESTHESIA:  General.  OPHTHALMOSCOPY:  As previously described.  OPERATIVE PROCEDURE:  After the patient was prepped and draped a lid speculum was inserted in the right eye.  The eye was turned downward and a superior rectus traction suture placed.  A peritomy was performed adjacent to the limbus from the 8 oclock to 2:30 position.  The subconjunctival tissue was cleaned.  Three strabotomy site were prepared at the 8:30, 10 oclock, and 2 oclock positions 3.5 mm from the limbus using the MVR blade.  The 4 mm vitreous infusion terminal was secured in place at the 8:30 position with a 5-0 white mattress Dacron suture.  Due to the dense vitreous hemorrhage, the tip could not be visualized in the vitreous cavity.  The fiberoptic light pipe was inserted at the 2 oclock position and the hand piece of the vitreous infusion suction cutter at the 10 oclock position.  Slow vitreous infusion suction cutting was begun from an anterior to posterior direction.  Extremely dense old okra and fresh red  blood were encountered behind the lens implant. As the vitrectomy was carried out toward the retina, dense vitreous membranes were encountered radiating from the optic nerve to the ora serrata.  These were severed 360 degrees.  Large pools of blood and blood clots were noted on the retina and it was difficult to visualize the retina as the hemorrhage was extremely dense.  Gradually, the retinal blood vessels came into view.  There appeared to be proliferative retinopathy at the optic nerve with neovascularization.  Inferiorly, there was an extremely dense blood clot which was slowly peeled with the [facility] pick and the hand piece of the vitreous infusion suction cutter off of the retinal surface in front of the macula. Inferiorly, however, the clot was extremely adherent to the retina and it was feared that retinal tearing and/or detachment would develop if marked traction were placed on this clot.  It was, therefore, elected to leave a portion of this clot which was outside the vascular arcade.  The macular area itself appeared to be clear with a few dot hemorrhages.  Continued aspiration of old and new blood was performed.  The vitreous gradually cleared and it was elected to perform panretinal laser photocoagulation.  A total of 929 applications were made using the Endoprobe at 1/10 of a second with a power level of 350 milliwatts.  Good laser applications were applied to the peripheral retina 360 degrees.  The laser photocoagulation was carried out adjacent to the optic nerve on the nasal side and along the vascular arcades. Indirect ophthalmoscopy with scleral depression revealed no peripheral  retinal tears or detachment areas.  It was then elected to close.  The vitreous instruments were withdrawn and the sclerotomy sites sutured with 7-0 Vicryl sutures.  The conjunctiva was then pulled forward and closed with 7-0 Vicryl running suture.  Depo-Garamycin and dexamethasone were injected  in the sub-Tenon space inferiorly.  Maxitrol and Atropine ointment were instilled in the conjunctival cul-de-sac.  A light patch and protector shield were applied to the operated right eye.  DURATION OF PROCEDURE:  2 to 2-1/2 hours.  The patient tolerated the procedure well in general and left the operating room for the recovery room and subsequently to the 23 hour observation unit. Dictated by:   Guadelupe Sabin, M.D. Attending Physician:  Ivor Messier DD:  02/15/01 TD:  02/15/01 Job: [id number]

## 2010-06-29 NOTE — Op Note (Signed)
[name], [name]           ACCOUNT NO.:  [id number]   MEDICAL RECORD NO.:  [id number]          PATIENT TYPE:  AMB   LOCATION:  DSC                          FACILITY:  [facility]   PHYSICIAN:  Leonides Grills, M.D.     DATE OF BIRTH:  [birth year]   DATE OF PROCEDURE:  06/18/2005  DATE OF DISCHARGE:                                 OPERATIVE REPORT   PREOPERATIVE DIAGNOSES:  1.  Right Achilles tendinopathy.  2.  Right tight gastrocnemius.  3.  Right Haglund's deformity.  4.  Calcific right Achilles tendinitis.   POSTOPERATIVE DIAGNOSES:  1.  Right Achilles tendinopathy.  2.  Right tight gastrocnemius.  3.  Right Haglund's deformity.  4.  Calcific right Achilles tendinitis.   OPERATION:  1.  Right flexor hallucis longus to calcaneus tendon transfer.  2.  Right gastrocnemius slide.  3.  Right excision, Haglund's deformity.  4.  Right debridement calcification, Achilles tendon.   ANESTHESIA:  General.   SURGEON:  Leonides Grills, M.D.   ASSISTANT:  Lianne Cure, P.A.   ESTIMATED BLOOD LOSS:  Minimal.   TOURNIQUET TIME:  None.   COMPLICATIONS:  None.   DISPOSITION:  Stable to PR.   INDICATIONS:  This is a 60 year old female who has had longstanding  posterior right ankle pain secondary to calcification within the Achilles  tendon, Haglund's deformity, retrocalcaneal bursitis, and Achilles  tendinopathy with a tight gastroc.  She was consented for the above  procedure.  All risks, which included infection, nerve and vessel injury,  Achilles tendon rupture, persistent pain, worsening pain, prolonged  recovery, stiffness, arthritis, chronic swelling, and bleeding were all  explained.  Questions were encouraged and answered.   OPERATION:  Patient was brought to the operating room and placed initially  in a supine position after adequate general endotracheal tube anesthesia was  administered as well as Ancef 1 gm IV piggyback.  The patient was then  placed in a sloppy  lateral position, slightly with the operative side down.  All bony prominences were well padded.  Due to her obesity, we were unable  to use the tourniquet, and this also made the procedure much more difficult  than normal.  We started the procedure with a longitudinal incision of the  medial head of the right gastrocnemius.  Dissection was carried down through  skin.  Hemostasis was obtained.  After careful dissection through a thick  layer of adipose tissue, the fascia was opened in line with the incision.  A  contract region was then developed within the gastroc and soleus.  Soft  tissue was then all laid off the posterior aspect of the gastrocnemius.  Sural nerve identified and protected posteriorly.  The gastrocnemius was  then released with the curved Mayo scissors.  This had an external release  of the tight gastroc.  The area was copiously irrigated with normal saline.  The subcu was closed with 3-0 Vicryl, and the skin was closed with 4-0 nylon  due to the fact of her obesity and the worry that the wound may dehisce  using 4-0 Monocryl subcuticular stitch.  Once the wound  was closed after  copious irrigation, we then made a longitudinal incision over the  anteromedial aspect of the Achilles tendon distally.  Dissection was carried  down through the skin.  Hemostasis was obtained.  Careful dissection was  carried down through adipose tissue down to the fascia.  The fascia medially  was opened in line with the incision just anterior medial to the Achilles  tendon.  We then under C-arm guidance identified the extent of the Haglund's  deformity as well as calcification of the Achilles tendon.  We then made a  longitudinal incision on the medial aspect midline of the Achilles tendon.  Dissection was carried down to the calcification, and this was carefully  removed.  There was a very small area of calcification left within the  Achilles tendon, but this was left due to the fact that more  further  dissection may have compromised the Achilles tendon.  We then removed the  Haglund's deformity, protecting the soft tissue medially and laterally and  resecting with the sagittal saw.  Once this was removed and carefully  dissected out, the area was copiously irrigated with normal saline.  We then  carefully dissected out the FHL tendon muscle belly.  Fascia was then  released proximally and distally.  We then traced the FHL tendon to the  sustentaculum tali with the ankle in maximum plantar flexion and the great  toe in maximum plantar flexion.  The FHL tendon was then tenotomized.  We  then placed a #2 FiberWire, modified Krackow stitch in the distal end of  stump at the FHL tendon.  We then placed a guidewire into the calcaneus at  the area where the Haglund's deformity was performed.  This was angled about  45 degrees.  We then drilled an 8 mm drill over the guidewire, removed the  guidewire, and this was drilled to a depth of 30 mm.  We then chose an 8 x  23 mm [employer] tenodesis bioabsorbable screw, placed this on the  instrumentation to place the screw within the tunnel.  We lassoed the  tendon, placed the tendon at the depths of the tunnel, and advanced the  screw.  This had excellent purchase and maintenance of the correct tension  on the FHL tendon.  Range of motion of the ankle was excellent, and there  were no adverse tension on the FHL tendon as well.  The muscle belly was  pulled down to the level of the calcaneus, and it covered the entire length  of the diseased Achilles tendon.  We then repaired the FHL to the Achilles  tendon and repaired the defect within the Achilles tendon as well with two  stitches of #2 FiberWire followed by 2-0 FiberWire.  Once this was  completely repaired, we then again arranged the ankle, and there was  adequate tension on the FHL tendon.  The area was copiously irrigated with normal saline.  The remaining part of the Achilles tendon was  repaired with  2-0 FiberWire suture.  The area once again was copiously irrigated with  normal saline, and the subcu was closed with 3-0 Vicryl, and the skin was  closed with 4-0 nylon.  Due to the obesity of this patient, it made the  procedure a lot more difficult than normal.  It likely added an extra half  hour to 45 minutes to the procedure and difficulty in dissection and repair.  Once the wound was closed with a 4-0 nylon suture, sterile dressing  was  applied, a modified Jones dressing was applied with gravity equinus.  Patient was stable to the PR.      Leonides Grills, M.D.  Electronically Signed     PB/MEDQ  D:  06/18/2005  T:  06/18/2005  Job:  [id number]

## 2010-06-29 NOTE — Discharge Summary (Signed)
[name], [name]           ACCOUNT NO.:  [id number]   MEDICAL RECORD NO.:  [id number]          PATIENT TYPE:  INP   LOCATION:  5038                         FACILITY:  [facility]   PHYSICIAN:  Lianne Cure, P.A.  DATE OF BIRTH:  1950-11-06   DATE OF ADMISSION:  06/22/2005  DATE OF DISCHARGE:  06/24/2005                                 DISCHARGE SUMMARY   [name] was admitted on Jun 22, 2005 secondary to falling out of her hospital  bed.  [name] had surgery by Dr. Leonides Lucero the previous Tuesday.  As an  outpatient, it was discovered that [name] was unable to ambulate, did not have  anyone to care for her at home, had difficulty with pain control and was  admitted to [facility] postoperative day one.  [name] then was  discharged on Jun 21, 2005 by Dr. Leonides Lucero and fell out of her hospital  bed on Jun 22, 2005 and was brought back to the emergency room.  No new  musculoskeletal complaints.  [name] was just unable to care for the  patient.   PAST MEDICAL HISTORY:  1.  Diabetes.  2.  Hypertension.  3.  Right lower extremity surgery.  4.  Lumbar surgery previously.  5.  Retinopathy.  6.  History of coronary artery disease.   ALLERGIES:  [name] has a drug allergy to CODEINE.   CURRENT MEDICATIONS:  Prandin, Zoloft, Plavix, Lasix, Altace, Coreg,  Trazodone, Zocor, Avandia, Metformin and Lantus insulin.   During her stay, there were no complications.  The patient receive PT and OT  consult.  Pain was well controlled with Percocet.  [name] is stable, taking  p.o. medicines and taking food by mouth without difficulties.   Labs on admission showed temperature was 97.1, pulse 94, respirations 18,  blood pressure was 200/88.  No other labs or x-rays were taken at this time.   IMPRESSION:  The patient is stable, regular diet.   PLAN:  [name] is going to be discharged home with care from [facility].  [name] will be receiving physical therapy, occupational therapy, home health  assistance.  They have a ramp for her to get in and out of her house,  hospital bed, wheelchair with elevating leg rest and an elevated rolling  walker.  [name] is to follow up with Dr. Leonides Lucero in one week.  Recommend  elevation of the left lower extremity, active range of motion of the toes,  nonweightbearing.  Keep the dressing clean, dry and intact.     Lianne Cure, P.A.    MC/MEDQ  D:  06/24/2005  T:  06/25/2005  Job:  [id number]

## 2010-06-29 NOTE — Assessment & Plan Note (Signed)
[facility]                             PRIMARY CARE OFFICE NOTE   NAME:Buick, [name]                  MRN:          [id number]  DATE:10/10/2005                            DOB:          [birth year]    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  Patient also is status post  appendectomy, c-section, cholecystectomy, endarterectomy on the right as  noted, lumbar diskectomy and multiple surgeries on her eye.   CURRENT MEDICATIONS:  1. Coreg 25 mg b.i.d.  2. Lasix 40 mg b.i.d.  3. Lantus 70 units q.a.m.  4. Trazodone 50 mg q.h.s.  5. Multivitamin daily.  6. Altace 10 mg daily.  7. Metformin 1 g q. day.  8. Fenofibrate daily.  9. Avandia 2 mg daily.  10.Zocor 40 mg daily.  11.Plavix 75 mg daily.  12.NovoLog on sliding scale.  13.Topamax 25 mg daily.   Patient presents today for evaluation for a power mobility device.   Questioning the patient, she is able to manage all of her activities of  daily living in the house and is mobile, lives in the home.  She reports  that she can not stand for long periods of time at the sink because of leg  pain and discomfort.  She also reports she uses a rolling business chair  when she has to do certain chores in the house that require bending over  which is difficult for her because of chronic back pain.  Patient also has  had recent heel surgery.   Patient does report that she needs to have some power mobility assistance to  manage outdoor activities, because of her poor endurance and inability to  walk far or long.  Her symptoms are based upon her coronary vascular  disease, her diabetes with diabetic neuropathy, blindness in her right eye,  tendinitis of both Achilles  tendons and her obesity.  Patient does have  shortness of breath with exertion.  She does have problems with poor  balance.   Patient would not need any power mobility assistive equipment in the house  or home but would need mobility assistive equipment for outdoor activity.   Patient's other medical problems were not addressed at today's visit.  She  was last seen in the office June 04, 2005.  Please see that complete  dictation for full H&P.  Patient does continue in the ACCORD study.  Her  hypertension has been reasonably well controlled.   This is to accompany the mobility assistive equipment paperwork, and I am  going to hold her chart for that.                                   Rosalyn Gess Norins, MD   MEN/MedQ  DD:  10/10/2005  DT:  10/11/2005  Job #:  [id number]

## 2010-06-29 NOTE — H&P (Signed)
[name]. [facility]  Patient:    [name] Visit Number: [id number] MRN: [id number]          Service Type: DSU Location: 5700 [id number] [id number] Attending Physician:  Ivor Messier Dictated by:   Guadelupe Sabin, M.D. Admit Date:  02/13/2001 Discharge Date: 02/14/2001   CC:         [name], M.D. [facility]  Dr. Cherly Hensen ([facility])   History and Physical  REASON FOR ADMISSION:  This was a planned outpatient surgical admission of this 60 year old, insulin-dependent diabetic, admitted for vitreoretinal surgery of a dense vitreous hemorrhage of the right eye and proliferative diabetic retinopathy.  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  Also, the patient has had right carotid endarterectomy 12 years ago.  Her diabetes mellitus began with gestational diabetes 20 years ago.  The patient has had back surgery with a ruptured disk, etc.  This patient began to note the onset of blurred vision and was seen by Dr. Corbin Ade, an associate of Dr. Birdena Jubilee.  Dr. Cherly Hensen noted her insulin-dependent diabetes mellitus and diabetic retinopathy with neovascularization.  The patient was referred to my office November 27, 2000, where this diagnosis was confirmed.  It was felt that this patient would probably warrant panretinal laser photocoagulation due to her neovascularization of the optic nerve of the right eye.  Arrangements were being made for this with approval of her partners health plan.  The patient suddenly developed a preretinal hemorrhage covering her macular area and reduced her vision from 20/30 to less than 20/200.  It was felt that this was an indication for vitrectomy surgery as the macular area was  completely covered.  Subsequently, the hemorrhage broke through the macular preretinal space and the patient developed a diffuse vitreous hemorrhage.  Vision was reduced to finger-counting at best.  The patient elected to proceed with vitreous surgery at this time.  PAST MEDICAL HISTORY:  See above.  CURRENT MEDICATIONS:  The patient is currently taking Coumadin, Lasix, blood pressure medication, in additional to her Humulin-N insulin.  According to Dr. Debby Bud, the patient had been previously treated with aspirin and Plavix, but developed a cerebrovascular stroke on these medications.  REVIEW OF SYSTEMS:  No current cardiorespiratory complaints.  PHYSICAL EXAMINATION:  VITAL SIGNS:  On recorded on admission, blood pressure 188/100, temperature 99.2, pulse 77, respirations 20.  GENERAL APPEARANCE:  The patient is a pleasant well-nourished, well-developed white female in acute ocular distress.  The patient is quite anxious over the prospect of her anesthesia and surgery.  HEENT:  EYES:  Visual acuity is hand motion to finger-counting in right eye; 20/25 left eye.  APPLANATION TONOMETRY:  16 mm each eye.  EXTERNAL OCULAR AND SLIT LAMP EXAMINATION:  The eyes are white and clear with a clear cornea, deep and clear anterior chamber.  No iris neovascularization.  The lens is clear. Pupils are round, regular, equal, and reactive and ocular motility appears normal.  DETAILED FUNDUS EXAMINATION:  Right eye indirect ophthalmoscopy reveals a dense vitreous hemorrhage present and the fundus reflex is black. No retinal details can be seen.  Left eye reveals a clear vitreous, attached retina, with diabetic retinopathy with multiple scattered blot and dot hemorrhages.  CHEST:  Lungs clear to percussion and auscultation.  HEART:  Normal  sinus rhythm.  No cardiomegaly.  No murmurs.  ABDOMEN:  Negative.  EXTREMITIES:  Negative.  ADMISSION DIAGNOSES: 1. Acute and chronic vitreous hemorrhage,  right eye. 2. Diabetic retinopathy, proliferative with optic nerve neovascularization -    right eye. 3. Background diabetic retinopathy - preproliferative retinopathy - left eye. 4. Insulin-dependent diabetes mellitus.  SECONDARY DIAGNOSIS:  Cerebrovascular stroke x 3, with anticoagulation.  SURGICAL PLAN:  Posterior vitrectomy with pars plana using vitreous infusion suction cutter with membrane peeling and endolaser photocoagulation.  The patient has been informed of the procedure and its possible complications. She has signed an informed consent.  The arrangements were made for her outpatient admission at this time. Dictated by:   Guadelupe Sabin, M.D. Attending Physician:  Ivor Messier DD:  02/15/01 TD:  02/15/01 Job: [id number]

## 2010-06-29 NOTE — Discharge Summary (Signed)
[name], [name]           ACCOUNT NO.:  [id number]   MEDICAL RECORD NO.:  [id number]          PATIENT TYPE:  INP   LOCATION:  5023                         FACILITY:  [facility]   PHYSICIAN:  Leonides Grills, M.D.     DATE OF BIRTH:  10-Jun-1950   DATE OF ADMISSION:  06/19/2005  DATE OF DISCHARGE:  06/21/2005                                 DISCHARGE SUMMARY   CHIEF COMPLAINT:  Poor O2 sats.   HISTORY:  This is a 60 year old female who on Jun 19, 2005, underwent a right  FHL to calcaneus transfer, gastroc slide, excision Haglund's deformity, and  excision calcification Achilles Tendon at [facility].  Postoperatively, she had sats that dipped to the 70s.  Due to this fact, we  admitted her to [facility] for observation.  During her  hospitalization, she did very well.  Her O2 sats improved significantly.  She did not have any shortness of breath, chest pain during this time.  She  underwent physical therapy and occupational therapy for gait training and  during this time period as well social services set her up for home health  and assistive devices.  Please refer to H&P on chart.   DISPOSITION:  She is to follow up with Dr. Lestine Box in two weeks.  She is to  call for an appointment.  She is to keep this elevated, active range of  motion toes encouraged.  She is non-weightbearing right lower extremity.  She is to keep the dressing clean, dry, and intact as well.  Any problems,  she is to call at any time.      Leonides Grills, M.D.  Electronically Signed     PB/MEDQ  D:  06/21/2005  T:  06/22/2005  Job:  [id number]

## 2010-06-29 NOTE — Assessment & Plan Note (Signed)
[facility]                             PRIMARY CARE OFFICE NOTE   NAME:Lucero, Kendra IBBOTSON                  MRN:          [id number]  DATE:11/15/2005                            DOB:          Sep 23, 1950    [name] is very well known to me and the practice.  This is a 60-year-  old Caucasian woman who presents now for follow-up evaluation and exam.  She  was last seen in primary care October 10, 2005.  In the interval, the patient  continues to participate in the [facility] study and has got her labs done  there.  She generally reports she is feeling well.  Of note, her last  hemoglobin A1c was two weeks ago and she reports now that is 6%.   PAST MEDICAL HISTORY:  Well documented in my note of June 04, 2005.   CURRENT MEDICATIONS:  1. Coreg 25 mg twice daily.  2. Lasix 40 mg twice daily.  3. Lantus 70 units every morning.  4. Trazodone 50 mg at bedtime.  5. Altace 10 mg daily.  6. Metformin 1000 mg daily.  7. Avandia 2 mg daily.  8. Zocor 40 mg daily.  9. Plavix 75 mg daily.  10.Novolog sliding scale.  11.Topamax 25 mg daily.  12.The patient is on a study drug through the [facility]ORD program, Sertraline      100 mg twice daily.  13.Aleve on a p.r.n. basis.   HEALTH MAINTENANCE:  The patient's last pelvic and Pap was August 2007.  Last mammogram was also in August of 2007 through the [facility] study.   REVIEW OF SYSTEMS:  Negative for any constitutional problems.  Recent eye  exam, is up-to-date.  No ENT, cardiovascular, respiratory, GI or GU  problems.   PHYSICAL EXAMINATION:  VITAL SIGNS:  Temperature was 98.5.  Blood pressure  181/88, pulse 75, weight 294.  GENERAL APPEARANCE:  This is an obese Caucasian female who looks her stated  age, in no acute distress.  HEENT:  Normocephalic and atraumatic.  External auditory canals and tympanic  membranes were normal.  Oropharynx was without lesions.  Conjunctivae and  sclerae were clear.  Pupils  equal, round and reactive to light and  accommodation.  Funduscopic exam was deferred to ophthalmology.  NECK:  Supple without thyromegaly.  NODES:  No adenopathy was noted in the cervical or supraclavicular regions.  CHEST:  No costovertebral angle tenderness.  LUNGS:  Clear to auscultation and percussion.  CARDIOVASCULAR:  2+ radial pulse.  She had a quiet precordium.  She had a  regular rate and rhythm without murmurs.  The patient has positive edema  without pitting.  ABDOMEN:  Obese, hindering further examination.  PELVIC/RECTAL:  Deferred to the [facility]ORD study.  EXTREMITIES:  Enlarged secondary to her weight but no deformities are noted.   ASSESSMENT AND PLAN:  1. Hypertension.  The patient's systolic blood pressure is markedly      elevated at today's visit.  In reviewing her chart, she is generally      better controlled.  Plan:  The patient will continue present  medications.  She is asked to monitor her blood pressure either at home      or in the office.  If she continues to have systolic hypertension, we      will adjust her medications.  2. Diabetes.  The patient is well-controlled with a hemoglobin A1c of 6%      through the [facility] study.  3. Hyperlipidemia.  The patient is on Zocor.  Last lab from the [facility]ORD      study gives her an LDL range of 40 to 100 without further breakdown.      Will defer monitoring to the study.  4. Health maintenance.  The patient does need to have colonoscopy.  Given      her multiple comorbidities, she will require referral to the GI      department for office evaluation prior to scheduling.   In summary, this is a patient who is managing her comorbidities well.  I  have asked her to monitor her blood pressure at home as noted.  She will be  notified by our patient care coordinators in regard to referral to GI.            ______________________________  [name], MD      MEN/MedQ  DD:  11/16/2005  DT:  11/18/2005  Job  #:  [id number]

## 2010-06-29 NOTE — H&P (Signed)
[place]. [facility]  Patient:    [name], [name]                  MRN: [id number] Adm. Date:  [id number] Attending:  Corwin Levins                         History and Physical  CHIEF COMPLAINT: Right-sided weakness and left-sided headache.  HISTORY OF PRESENT ILLNESS: [name] is a 60 year old white female who awoke this morning with left-sided headache.  Approximately 30 minutes later, around 8 a.m., she noted right-sided weakness with right-sided relative weakness facial as well as upper and lower extremities.  She had difficulty later on around 1 p.m. taking a bath because of the weakness and she decided to come to the emergency room.  She has had some difficulty finding words during the history.  No slurred speech or trouble swallowing.  She is right-handed.  There is no prior history of stroke although on MRI in February 2001 she did have a small lacunar infarct, left-sided.  PAST MEDICAL HISTORY:  1. Diabetes mellitus.  2. Hypertension.  3. Morbid obesity.  4. Depression/anxiety/insomnia.  5. History of "palpitations" on September 18, 1999 CPX by Dr. Debby Bud.  6. History of peripheral vascular disease, status post right CVA     approximately ten years ago after TIA-like symptoms.  PAST SURGICAL HISTORY:  1. Status post appendectomy.  2. Status post cesarean section.  3. History of lumbar disk surgery approximately 20 years ago.  ALLERGIES: No known drug allergies.  MEDICATIONS:  1. Lasix 40 mg p.o. q.d.  2. K-Dur 20 mEq p.o. q.d.  3. Naprosyn 550 mg p.o. b.i.d. p.r.n.  4. Zoloft 200 mg p.o. q.d.  5. Ecotrin 325 mg p.o. q.d.  6. Plavix 75 mg p.o. q.d.  7. Trazodone q.h.s.  8. Altace 10 mg p.o. q.d.  9. Actos 30 mg p.o. q.d. 10. Insulin 70/30 40 units subQ b.i.d. 11. Coreg q.d.  SOCIAL HISTORY: No tobacco, no alcohol.  Married with one child.  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  FAMILY HISTORY: Mother deceased with cancer.  Father deceased with cancer after stroke.  Sister ten years older with stroke as well.  REVIEW OF SYSTEMS: Otherwise noncontributory.  She denies sore throat, chills, GU symptoms, or cough.  PHYSICAL EXAMINATION:  GENERAL: [name] is a 60 year old white female, irritable, morbidly obese.  She is alert and oriented x 3.  VITAL SIGNS: Blood pressure 150/80, respirations 18, pulse 84, temperature 100.0 degrees.  HEENT: Sclerae clear.  TMs clear.  Pharynx benign.  NECK: She has a scar of the right neck.  Otherwise, no lymphadenopathy, JVD, thyromegaly.  CHEST: No rales or wheezing.  CARDIAC: Regular rate and rhythm, somewhat distant.  ABDOMEN: Soft, nontender.  Positive bowel sounds.  No organomegaly.  No masses.  EXTREMITIES: No edema.  NEUROLOGIC: She is alert and oriented x 3.  She has mild right ptosis and slight to mild nasolabial fold straightening on the right.  On motor examination she has 4+/5 distal upper and lower extremity weakness.  She is also noted to have some hesitancy to finding words during history.  LABORATORY DATA: ECG shows sinus rhythm with nonspecific ST-T wave changes. Head CT shows new subacute left PCA distribution, probable CVA, subacute; old left thalamic lacunar infarct.  Glucose 323.  BUN 11, creatinine 0.7.  Electrolytes within normal limits. Hemoglobin 15  on i-STAT, hemoglobin 14.1 on hospital laboratories.  WBC 11.0. INR 1.2.  ASSESSMENT/PLAN:  1. Right-sided weakness consistent with left cerebrovascular accident.     She is to be admitted with neurologic checks q.4h.  Check 2D     echocardiogram, carotid arterial with Dopplers, monitor on telemetry.     Given Proton pump inhibitor previously.  Continue aspirin and Plavix but     will likely need long-term Coumadin.  Physical therapy to see for     evaluation of ambulation.  2. Diabetes mellitus.  Medications as above.  Will  have sliding-scale insulin     as-needed.  3. Hypertension, mild elevation on admission.  Will continue to follow.  4. Elevated temperature.  Questionable subclinical infection.  Check     urinalysis.  Follow symptoms.  5. Other medical problems as above. DD:  11/12/99 TD:  11/13/99 Job: [id number]

## 2010-06-29 NOTE — Assessment & Plan Note (Signed)
[facility]                           GASTROENTEROLOGY OFFICE NOTE   NAME:[name], [name]                  MRN:          [id number]  DATE:12/05/2005                            DOB:          1950/03/02    [name] is a 60 year old white female with peripheral vascular disease  and severe insulin dependant diabetes. She was referred by Dr. Debby Bud for  evaluation and screening colonoscopy.   This patient denies any GI complaints, upper or lower. She is having regular  bowel movements without melena or hematochezia. She has no evidence of  anemia, but I cannot see Hemoccult cards on her chart. She follows a regular  diet. She is somewhat apprehensive about having colonoscopy, but realizes  this is due because of her age.   FAMILY HISTORY:  Noncontributory.   PAST MEDICAL HISTORY:  Revolves around insulin dependant diabetes,  hypertension, small vessel occlusive disease, with old left and right  lacunar infarctions, including a thalamic infarction in 2001.  Hyperlipidemia, diabetic neuropathy with ataxia, previous right carotid  endarterectomy, and morbid obesity. She has a vitreous hemorrhage in her  right eye from her diabetes and is blind in her right eye. She has multiple  previous surgical procedures including disc surgery, lumbar spine, caesarean  sections, cholecystectomy, appendectomy, and carotid artery surgery. She  also has multiple orthopaedic problems as followed by [facility], and does use NSAIDs. She is currently followed further closely  by Dr. Debby Bud.   MEDICATIONS:  1. Coreg 25 mg twice daily.  2. Lasix p.r.n.  3. Lantus insulin 70 units, q.a.m.  4. Trazodone 50 mg at bedtime.  5. Altace 10 mg daily.  6. Metformin 1000 mg twice a day.  7. Avandia 2 mg daily.  8. Zocor 40 mg daily.  9. Plavix 75 mg daily.  10.Some unknown diabetic medication through a drug study at [facility].  11.Sertraline  100 mg twice a day.  12.Prandin 1 mg t.i.d.  13.Topamax 50 mg daily.  14.Aleve p.r.n.   ALLERGIES:  Denies drug allergies.   FAMILY HISTORY:  Noncontributory, except for multiple family members with  diabetes.   SOCIAL HISTORY:  Patient is married and lives with her husband. She has two  years of a college education. She is currently unemployed. She does not  smoke or abuse ethanol.   REVIEW OF SYSTEMS:  Remarkable for some peripheral edema of her lower  extremities, low back pain and diffuse arthralgias, blindness in her right  eye, some urinary incontinency. She denies current cardiovascular,  pulmonary, or neuro-psychiatric problems. Review of systems otherwise  noncontributory.   PHYSICAL EXAMINATION:  GENERAL: Shows her to be an obese appearing white  female in no acute distress. I cannot appreciate stigmata or chronic liver  disease.  VITAL SIGNS: She is 5 feet 6 inches tall and weighs 296 pounds, blood  pressure 142/90, pulse 80 and regular.  NECK:  I could not appreciate thyromegaly.  CHEST: Entirely clear to percussion and auscultation.  CARDIAC:  She appeared to be in a regular rhythm without significant  murmurs, gallops or rubs.  ABDOMEN: Somewhat obese, but I could not appreciate any definite  organomegaly, masses or tenderness. She had a well healed right upper  quadrant abdominal scar.  EXTREMITIES:  There was no edema, phlebitis, or swollen joints on exam  today.  NEUROLOGIC:  Mental status was clear.   ASSESSMENT:  1. Need for screening colonoscopy because of her age. I have discussed      alternative methods of examining her colon, including barium enema exam      and CAT scan colonoscopy with the risk and benefits of both procedures      and their relative reliability. As mentioned above, she is very      apprehensive about having any exam and we discussed the benefits,      risks, and complications of all these procedures in detail and she has       agreed to proceed with colonoscopy with monitorization.  2. Insulin dependent diabetes with apparently a recent hemoglobin A1c      being 6.0, which is consistent with good control.  3. Peripheral vascular disease at previous carotid surgery on chronic      Plavix therapy.  4. Well controlled essential hypertension.  5. Multiple orthopaedic problems including chronic low back pain.   RECOMMENDATIONS:  We will proceed with outpatient colonoscopy with close  monitorization and adjustment of her diabetic medications. I think she will  probably have to stay on her Plavix for this procedure because of the risk  of cerebrovascular accident. Again, these risks and benefits were explained  in detail and the patient has agreed to proceed as planned. She will attend  to her other medications as listed per above.   ADDENDUM:  Patient has also had previous appendectomy and benign breast  surgery. Her peripheral vascular surgeon was Dr. Liliane Bade, she is followed  by Dr. Eda Paschal, in terms of GYN followup.     [name]. Jarold Motto, MD, Caleen Essex, FAGA    DRP/MedQ  DD: 12/05/2005  DT: 12/06/2005  Job #: [id number]   cc:   [name], MD  Rande Brunt. Eda Paschal, M.D.  Rosario Jacks

## 2010-06-29 NOTE — Discharge Summary (Signed)
[place]. [facility]  Patient:    [name], [name] Visit Number: [id number] MRN: [id number]          Service Type: DSU Location: 5700 5730 01 Attending Physician:  Ivor Messier Dictated by:   Guadelupe Sabin, M.D. Admit Date:  02/13/2001 Discharge Date: 02/14/2001   CC:         [name], M.D. [facility]  Dr. Cherly Hensen ([facility])   Discharge Summary  HISTORY OF PRESENT ILLNESS:  This was a planned outpatient surgical admission of this 60 year old white female, insulin-dependent diabetic, with marked vascular disease and diabetic retinopathy.  (See detailed admission history and physical.)  HOSPITAL COURSE:  The patient was evaluated preoperatively and felt to be in satisfactory condition for the proposed surgery.  The patient had discontinued her Coumadin several days prior to admission and was being followed by Dr. Debby Bud with injections of anticoagulant daily, but these were stopped approximately two to three days prior to admission.  The patient was taken into the operating room where an extremely complex posterior vitrectomy was performed.  At surgery, the eye was placed with old okra-colored blood and also fresh scarlet blood and marked vitreous clot formation and fibrosis.  This extended down into the retinal surface where proliferative changes were noted with neovascularization at the optic nerve and inferiorly where a large blood clot was extremely adherent to the retinal surface.  Following slow membrane peeling and excision, the retina was cleared except for a small clot area which was extremely adherent to the retina inferiorly outside the vascular arcade.  Endolaser panretinal photocoagulation was applied.  The patient tolerated the 2 to 2-1/2 hour procedure under general anesthesia and was taken to the recovery room and subsequently to the 2300 hour observation unit.  The patients blood sugar remained  slightly elevated at approximately 250 to 300 and the consultation with Dr. Jonny Ruiz, Dr. Debby Bud partner, was performed.  By the following morning, the patient continued to clinically feel well, although her blood sugar still remained slightly elevated in the 200 to 300 range.  It was felt that the patient had achieved maximal hospital benefit and that she could control her blood sugars better with her multiple dose insulin schedule at home with her more normal activities.  A conversation with Dr. Debby Bud at the time of discharge was performed.  The need for anticoagulation was stressed.  The patient was discharged home by Dr. Cecilie Kicks and Dr. Debby Bud.  Dr. Debby Bud felt that the patient should probably at least be placed back on aspirin and/or additional Plavix.  The patient is to return to the office in three to five days.  The patient is to continue limitation of activity and use her topical medications for discharge which consisted of:  1. Tobradex ophthalmic solution 1 drop four times a day. 2. Cyclomydril ophthalmic solution 1 drop four times a day five minutes apart. 3. Maxitrol and Atropine ointment at bed time.  CONDITION ON DISCHARGE:  Relatively clear vitreous with some vitreous haze. The retina appeared attached and the optic nerve could be visualized with good panretinal photocoagulation.  Condition in discharge is improved.  DISCHARGE DIAGNOSES: 1. Advanced proliferative diabetic retinopathy with chronic and recurrent    vitreous hemorrhage, right eye. 2. Diabetes mellitus, insulin-dependent type.  SECONDARY DIAGNOSIS:  Cerebrovascular disease with multiple cerebrovascular strokes post carotid endarterectomy surgery. Dictated by:   Guadelupe Sabin, M.D. Attending Physician:  Ivor Messier DD:  02/15/01 TD:  02/15/01  Job: [id number]

## 2010-06-29 NOTE — Discharge Summary (Signed)
[place]. [facility]  Patient:    [name], [name]                  MRN: [id number] Adm. Date:  [id number] Disc. Date: [id number] Attending:  Corwin Levins Dictator:   Cornell Barman, P.A.                           Discharge Summary  DISCHARGE DIAGNOSES: 1. 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 2. Poorly controlled diabetes mellitus. 3. Hypercholesterolemia. 4. Lactobacillus urinary tract infection.  HISTORY OF PRESENT ILLNESS:  [name] is a 60 year old white female who awoke on the morning of admission with a left sided headache. Approximately 30 minutes later she noted right sided weakness in her upper and lower extremities. The patient presented for further evaluation. The patient does not have a prior history of stroke, although on an MRI in February 2001 she did have a small lacunar infarct on the left side.  PAST MEDICAL HISTORY: 1. Insulin dependent diabetes mellitus. 2. Hypertension. 3. Morbid obesity. 4. Depression, anxiety and insomnia. 5. History of palpitations. 6. History of peripheral vascular disease, status post right cerebrovascular    accident approximately ten years ago, after transient ischemic attack-like    symptoms.  PAST SURGICAL HISTORY: 1. Status post appendectomy. 2. Status post C-section. 3. History of lumbar disc surgery 20 years ago.  LABORATORY DATA:  Head CT showed a new subacute left PCA distribution CVA with old left thalamic lacunar infarct.  HOSPITAL COURSE: #1 - NEUROLOGIC:  The patient presented with right sided weakness and evidence of a new left CVA. The patient was admitted for further neuro evaluation. Carotid Doppler showed no significant ICA stenosis. 2-D echo showed no evidence of embolus. The patient had been on Plavix and aspirin prior to this admission and was started on Lovenox and Coumadin. The patient was advised of her multiple risk factors for vascular disease,  and encouraged to make some lifestyle changes. The patient is still having persistent right homonymous hemianopsia. MRI of the brain is pending at the time of this dictation. The patient had a neuro team evaluation and no further home health needs were indicated.  #2 - INFECTIOUS DISEASE:  The patient had evidence of a urinary tract infection. Initially she was started on Cipro. Cultures grew greater than 100,000 colonies of Lactobacillus. The patient will be treated for ten days with ampicillin.  #3 - INSULIN DEPENDENT DIABETES MELLITUS:  Elevated hemoglobin A1C at 9.8%. The patients blood sugars have been fairly well-controlled in this hospitalization, and we have started the patient on some Actos.  #4 - HYPERCHOLESTEROLEMIA:  The patient had elevated cholesterol and LVL. The patient was started on Pravachol.  LABORATORY DATA AT DISCHARGE:  MRI of the brain is still pending.  The patients pro time is 22.3, INR 2.8.  DISCHARGE PLAN:  Discharge home. Will have the patient [facility] on Monday October 8 at 12:30. The patient needs to followup with Dr. Debby Bud in two to three weeks and has been instructed not to drive until she sees Dr. Debby Bud.  DISCHARGE MEDICATIONS:  1. Lasix 40 mg q.d.  2. K-Dur 20 mEq q.d.  3. Zoloft 200 mg q.d.  4. Altace 10 mg q.d.  5. Pravachol 20 mg q.d.  6. Coumadin, dose to be determined by pharmacy.  7. Carvedilol 3.125 mg q.d.  8. Actos 30 mg q.d.  9. 70/30 40  units b.i.d. 10. Ampicillin 500 mg q.i.d. for seven days. DD:  11/15/99 TD:  11/15/99 Job: [id number]

## 2010-07-03 ENCOUNTER — Ambulatory Visit (INDEPENDENT_AMBULATORY_CARE_PROVIDER_SITE_OTHER): Payer: Medicare Other | Admitting: *Deleted

## 2010-07-03 DIAGNOSIS — E538 Deficiency of other specified B group vitamins: Secondary | ICD-10-CM

## 2010-07-04 MED ORDER — CYANOCOBALAMIN 1000 MCG/ML IJ SOLN
1000.0000 ug | Freq: Once | INTRAMUSCULAR | Status: AC
  Administered 2010-07-03: 1000 ug via INTRAMUSCULAR

## 2010-07-09 ENCOUNTER — Other Ambulatory Visit: Payer: Self-pay | Admitting: Internal Medicine

## 2010-07-11 NOTE — Telephone Encounter (Signed)
Ok for refill? 

## 2010-07-11 NOTE — Telephone Encounter (Signed)
The demographic information from the pharmacy is: Patient Name: [name], [name] Patient DOB: [birth year] Patient Gender: Female Address:    [address]    [facility]    [id number]      Currie Paris  60 y.o. / Female (02-02-1951)  Pharmacy: [id number] - [place], [place] - [address] Ph: [phone]   MRN: [id number]  PCP: Duke Salvia, MD  Wt: Not available   Home: [phone]  Work: Not available  Mobile: Not available       Requested Medications     promethazine (PHENERGAN) 12.5 MG tablet [Pharmacy Med Name: PROMETHAZINE 12.5 MG TABLET]    TAKE 1 TABLET BY MOUTH EVERY 6 HOURS AS NEEDED    Disp: 120 tablet R: 0 Start: 07/09/2010 Class: Normal    Last refill: 10/06/2009     Please Advise refills

## 2010-07-17 ENCOUNTER — Other Ambulatory Visit: Payer: Self-pay | Admitting: *Deleted

## 2010-07-17 ENCOUNTER — Telehealth: Payer: Self-pay | Admitting: *Deleted

## 2010-07-17 MED ORDER — CARVEDILOL 25 MG PO TABS: 25.0000 mg | ORAL_TABLET | Freq: Two times a day (BID) | ORAL | Status: DC

## 2010-07-17 NOTE — Telephone Encounter (Signed)
refill 

## 2010-07-30 ENCOUNTER — Ambulatory Visit (INDEPENDENT_AMBULATORY_CARE_PROVIDER_SITE_OTHER): Payer: Medicare Other | Admitting: Internal Medicine

## 2010-07-30 VITALS — BP 168/78 | HR 68 | Temp 97.1°F | Wt 180.0 lb

## 2010-07-30 DIAGNOSIS — I10 Essential (primary) hypertension: Secondary | ICD-10-CM

## 2010-07-30 MED ORDER — FLUDROCORTISONE ACETATE 0.1 MG PO TABS: 0.1000 mg | ORAL_TABLET | Freq: Two times a day (BID) | ORAL | Status: DC

## 2010-07-30 NOTE — Assessment & Plan Note (Addendum)
Blood pressure poorly controlled. Reviewed hospital records and office records.  Plan - will increase carvedilol to 25mg  bid, continue furosemide 40mg .           Monitor BP at home and report back.

## 2010-07-30 NOTE — Progress Notes (Signed)
  Subjective:    Patient ID: [name], female    DOB: 1951/01/16, 60 y.o.   MRN: [id number]  HPI [name] presents for persistent hypertension. Reviewed her medications with her husband who is the primary care-giver. At time of last d/c from [facility] carvedilol was reduced to 0.5 tab 25mg  bid. Also she was to be on amlodipine 2.5 mg but this was not carried over. Her BP has been in 170-180/90-100. She has been asymptomatic.  She has multiple medical problems but by husband's report she is stable. The biggest change her husband reports is progressive memory loss. 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   Past Medical History: Coronary artery disease (right) Depression Diabetes mellitus, type II Hyperlipidemia Hypertension Cerebrovascular accident, hx of blind right eye - DM Vascular orthostatic hypotension with syncope  Past Surgical History: Appendectomy Caesarean section Cholecystectomy Endarterectomy Lumbar Diskectomy retinal detachment repair - "gas bubble"  June '10  Family History: father-deceased @80 : CAD, HTN mother - deceased @ 43: cancer lung, DM, HTN Neg- breast or colon cancer; no CAD/MI  Social History: Financial planner. [place] -'[address] [employer] married '74 1 daughter -'69 work: Systems analyst until disabled stable home situation.        Review of Systems Review of Systems  Constitutional:  Negative for fever, chills, activity change and unexpected weight change.  HEENT:  Positive for hearing loss.Negative for  ear pain, congestion, neck stiffness and postnasal drip. Negative for sore throat or swallowing problems. Negative for dental complaints.   Eyes: Blind right eye, some visual loss left eye.  Respiratory: Negative for chest tightness and wheezing.   Cardiovascular: Negative for chest pain and palpitation. No decreased exercise tolerance Gastrointestinal: No change in bowel habit. No bloating or gas. No reflux or  indigestion Genitourinary: Negative for urgency, frequency, flank pain and difficulty urinating.  Musculoskeletal: Negative for myalgias, back pain, arthralgias and gait problem.  Neurological: Positive for dizziness,  Weakness. Negative for tremors and headaches.  Hematological: Negative for adenopathy.  Psychiatric/Behavioral: Negative for behavioral problems and dysphoric mood.       Objective:   Physical Exam Vitals noted. Gen'l - older white woman who has lost weight who is in no distress.  HEENT - partially closed right eye with no visual acuity. Left eye with clear C&S, normal pupil. Chest - CTAP Cor- RRR Neuro - awake and alert to the situation.       Assessment & Plan:

## 2010-07-30 NOTE — Patient Instructions (Signed)
Blood pressure - reviewed hospital discharge medication list and home list. Plan - continue furosemide 40mg  once a day; take the carvedilol 25mg  one full tablet AM and PM for BP control. Keep a record of BP readings.  Continue all the other medications listed below. Note that I have change the fludrocortisone to 2 tablets in AM and 1 tablet at PM.

## 2010-08-03 ENCOUNTER — Ambulatory Visit: Payer: Medicare Other

## 2010-08-22 ENCOUNTER — Other Ambulatory Visit: Payer: Self-pay | Admitting: Internal Medicine

## 2010-08-27 ENCOUNTER — Other Ambulatory Visit: Payer: Self-pay | Admitting: Internal Medicine

## 2010-09-03 ENCOUNTER — Ambulatory Visit (INDEPENDENT_AMBULATORY_CARE_PROVIDER_SITE_OTHER): Payer: Medicare Other | Admitting: *Deleted

## 2010-09-03 DIAGNOSIS — E538 Deficiency of other specified B group vitamins: Secondary | ICD-10-CM

## 2010-09-03 MED ORDER — CYANOCOBALAMIN 1000 MCG/ML IJ SOLN
1000.0000 ug | Freq: Once | INTRAMUSCULAR | Status: AC
  Administered 2010-09-03: 1000 ug via INTRAMUSCULAR

## 2010-09-27 ENCOUNTER — Telehealth: Payer: Self-pay | Admitting: *Deleted

## 2010-09-27 MED ORDER — TRIAZOLAM 0.25 MG PO TABS: 0.2500 mg | ORAL_TABLET | Freq: Every day | ORAL | Status: DC

## 2010-09-27 NOTE — Telephone Encounter (Signed)
Pt requesting refill on Halcion tab 0.25 mg SIG take 1 tablet at bedtime prn QTY 90 Fax from [facility]. Fax rx to [phone]. Please Advise refills

## 2010-09-27 NOTE — Telephone Encounter (Signed)
Ok for refill x 5 

## 2010-09-28 NOTE — Telephone Encounter (Signed)
Faxed prescription for pt

## 2010-10-02 ENCOUNTER — Telehealth: Payer: Self-pay | Admitting: *Deleted

## 2010-10-02 NOTE — Telephone Encounter (Signed)
Pt's husband is req a call back regarding refill of triazolam. Our records show that RX was faxed on 8/17.

## 2010-10-03 ENCOUNTER — Ambulatory Visit: Payer: Medicare Other | Admitting: *Deleted

## 2010-10-03 DIAGNOSIS — E538 Deficiency of other specified B group vitamins: Secondary | ICD-10-CM

## 2010-10-03 MED ORDER — CYANOCOBALAMIN 1000 MCG/ML IJ SOLN
1000.0000 ug | Freq: Once | INTRAMUSCULAR | Status: AC
  Administered 2010-10-03: 1000 ug via INTRAMUSCULAR

## 2010-10-03 NOTE — Telephone Encounter (Signed)
Pt here for injection today and talked w/ami

## 2010-10-09 ENCOUNTER — Other Ambulatory Visit: Payer: Self-pay | Admitting: *Deleted

## 2010-10-09 MED ORDER — TRIAZOLAM 0.25 MG PO TABS: 0.2500 mg | ORAL_TABLET | Freq: Every day | ORAL | Status: DC

## 2010-10-20 ENCOUNTER — Other Ambulatory Visit: Payer: Self-pay | Admitting: Internal Medicine

## 2010-10-22 ENCOUNTER — Other Ambulatory Visit: Payer: Self-pay | Admitting: Internal Medicine

## 2010-11-02 ENCOUNTER — Ambulatory Visit (INDEPENDENT_AMBULATORY_CARE_PROVIDER_SITE_OTHER): Payer: Medicare Other | Admitting: *Deleted

## 2010-11-02 DIAGNOSIS — E538 Deficiency of other specified B group vitamins: Secondary | ICD-10-CM

## 2010-11-02 DIAGNOSIS — Z23 Encounter for immunization: Secondary | ICD-10-CM

## 2010-11-02 MED ORDER — CYANOCOBALAMIN 1000 MCG/ML IJ SOLN
1000.0000 ug | Freq: Once | INTRAMUSCULAR | Status: AC
  Administered 2010-11-02: 1000 ug via INTRAMUSCULAR

## 2010-11-02 NOTE — Progress Notes (Signed)
Pt supplied B12

## 2010-12-03 ENCOUNTER — Ambulatory Visit: Payer: Medicare Other

## 2010-12-03 ENCOUNTER — Ambulatory Visit (INDEPENDENT_AMBULATORY_CARE_PROVIDER_SITE_OTHER): Payer: Medicare Other | Admitting: Internal Medicine

## 2010-12-03 VITALS — BP 152/82 | HR 68 | Temp 97.1°F | Wt 180.0 lb

## 2010-12-03 DIAGNOSIS — E538 Deficiency of other specified B group vitamins: Secondary | ICD-10-CM

## 2010-12-03 DIAGNOSIS — I951 Orthostatic hypotension: Secondary | ICD-10-CM

## 2010-12-03 DIAGNOSIS — I2510 Atherosclerotic heart disease of native coronary artery without angina pectoris: Secondary | ICD-10-CM

## 2010-12-03 DIAGNOSIS — I10 Essential (primary) hypertension: Secondary | ICD-10-CM

## 2010-12-03 DIAGNOSIS — E119 Type 2 diabetes mellitus without complications: Secondary | ICD-10-CM

## 2010-12-03 DIAGNOSIS — E785 Hyperlipidemia, unspecified: Secondary | ICD-10-CM

## 2010-12-03 DIAGNOSIS — N184 Chronic kidney disease, stage 4 (severe): Secondary | ICD-10-CM

## 2010-12-03 DIAGNOSIS — F329 Major depressive disorder, single episode, unspecified: Secondary | ICD-10-CM

## 2010-12-03 MED ORDER — CYANOCOBALAMIN 1000 MCG/ML IJ SOLN
1000.0000 ug | Freq: Once | INTRAMUSCULAR | Status: AC
  Administered 2010-12-03: 1000 ug via INTRAMUSCULAR

## 2010-12-03 NOTE — Progress Notes (Signed)
  Subjective:    Patient ID: [name], female    DOB: 11/11/1950, 60 y.o.   MRN: [id number]  HPI [name] presents for routine follow-up. She is now receiving Procrit for chronic anemia. She is very concerned about potential side effects: stroke, HTN, etc. Discussed that the benefit of treating anemia outweighs the risk of the Procrit and that her nephrologist is and will be careful about the level of medication. She has otherwise been doing pretty well.   She reports that every several weeks she will have an episode of spontaneous vomiting that happens without warning. She generally has not been having any problems with her gastroparesis. No change in vision.   Several weeks ago she stopped taking sertraline. She reports, confirmed by husband, that until she became upset about procrit she was doing well. On questioning she admits that she is not sleeping and that she has had more anxiety.  I have reviewed the patient's medical history in detail and updated the computerized patient record.    Review of Systems System review is negative for any constitutional, cardiac, pulmonary, GI or neuro symptoms or complaints other than as described in the HPI.     Objective:   Physical Exam Vitals - mild systolic hypertension noted. Gen'l - an overweight white woman in no acute distress but emotionally fragile and tearful HEENT - right eye with lid drooping and loss of vision. Left eye with clear C&S Pulm - normal respirations, no increased work of breathing, no rales Cor - 2+ radial pulse and a RRR Neuro - A&O x 3, no slurred speech, cognition seems normal. Gait normal without need for assistance.       Assessment & Plan:

## 2010-12-04 ENCOUNTER — Other Ambulatory Visit: Payer: Self-pay | Admitting: Internal Medicine

## 2010-12-04 ENCOUNTER — Encounter: Payer: Self-pay | Admitting: Internal Medicine

## 2010-12-04 NOTE — Assessment & Plan Note (Signed)
continuing replacement.

## 2010-12-04 NOTE — Assessment & Plan Note (Signed)
No chest pain at rest or exertion. Appears stable with no evidence of decompensation.

## 2010-12-04 NOTE — Assessment & Plan Note (Signed)
Last notes from Dr. Kathrene Bongo Sept '12. Stable CKD IV. She has attended class and when the time comes she prefers peritoneal dialysis.   She does have CKD associated anemia currently receiving Procrit. Her Hgb has been in the 10 range and iron stores are monitored. She is reassured about the safety and reasonableness of continuing treatment under the careful guidance of Dr, Kathrene Bongo.

## 2010-12-04 NOTE — Assessment & Plan Note (Signed)
Last LDL 1 year ago was 86. She continues on zocor at low dose.  Plan - lipid panel at next blood draw

## 2010-12-04 NOTE — Assessment & Plan Note (Signed)
Had been doing well with sertraline but husband thought it was causing ennui and stopped it. She may be a bit anxious and depressed.  Plan - suggested she reconsider and resume taking sertraline if her mood does not improve.

## 2010-12-04 NOTE — Assessment & Plan Note (Signed)
Lab Results  Component Value Date   HGBA1C  Value: 5.3 (NOTE)                                                                      09/07/2009   She is due for follow-up A1c which can be obtained with next lab draw at nephrologist.  She will continue her present regimen

## 2010-12-04 NOTE — Assessment & Plan Note (Signed)
She continues on Florinef and has been doing well.  Plan - refill Rx.

## 2010-12-04 NOTE — Assessment & Plan Note (Signed)
BP Readings from Last 3 Encounters:  12/03/10 152/82  07/30/10 168/78  01/25/10 132/78   Mixed control. She is not on an ACE or ARB.  Plan - will defer to nephrology as to whether she should start ACE or ARB

## 2010-12-06 ENCOUNTER — Other Ambulatory Visit: Payer: Self-pay | Admitting: *Deleted

## 2010-12-06 MED ORDER — FLUDROCORTISONE ACETATE 0.1 MG PO TABS: 0.2000 mg | ORAL_TABLET | Freq: Two times a day (BID) | ORAL | Status: DC

## 2010-12-19 ENCOUNTER — Encounter: Payer: Self-pay | Admitting: Gynecology

## 2010-12-19 DIAGNOSIS — D219 Benign neoplasm of connective and other soft tissue, unspecified: Secondary | ICD-10-CM | POA: Insufficient documentation

## 2011-01-01 ENCOUNTER — Other Ambulatory Visit: Payer: Self-pay | Admitting: Internal Medicine

## 2011-01-04 ENCOUNTER — Emergency Department (HOSPITAL_COMMUNITY)
Admission: EM | Admit: 2011-01-04 | Discharge: 2011-01-04 | Disposition: A | Discharge status: Home / Self Care | Payer: Medicare Other | Attending: Emergency Medicine | Admitting: Emergency Medicine

## 2011-01-04 ENCOUNTER — Ambulatory Visit: Payer: Medicare Other | Admitting: *Deleted

## 2011-01-04 ENCOUNTER — Other Ambulatory Visit: Payer: Self-pay

## 2011-01-04 ENCOUNTER — Encounter (HOSPITAL_COMMUNITY): Payer: Self-pay | Admitting: Emergency Medicine

## 2011-01-04 DIAGNOSIS — R55 Syncope and collapse: Secondary | ICD-10-CM

## 2011-01-04 DIAGNOSIS — E538 Deficiency of other specified B group vitamins: Secondary | ICD-10-CM

## 2011-01-04 DIAGNOSIS — R404 Transient alteration of awareness: Secondary | ICD-10-CM | POA: Insufficient documentation

## 2011-01-04 DIAGNOSIS — E119 Type 2 diabetes mellitus without complications: Secondary | ICD-10-CM | POA: Insufficient documentation

## 2011-01-04 DIAGNOSIS — Z8673 Personal history of transient ischemic attack (TIA), and cerebral infarction without residual deficits: Secondary | ICD-10-CM | POA: Insufficient documentation

## 2011-01-04 DIAGNOSIS — I10 Essential (primary) hypertension: Secondary | ICD-10-CM | POA: Insufficient documentation

## 2011-01-04 DIAGNOSIS — Z7982 Long term (current) use of aspirin: Secondary | ICD-10-CM | POA: Insufficient documentation

## 2011-01-04 DIAGNOSIS — R42 Dizziness and giddiness: Secondary | ICD-10-CM | POA: Insufficient documentation

## 2011-01-04 DIAGNOSIS — I951 Orthostatic hypotension: Secondary | ICD-10-CM | POA: Insufficient documentation

## 2011-01-04 DIAGNOSIS — Z79899 Other long term (current) drug therapy: Secondary | ICD-10-CM | POA: Insufficient documentation

## 2011-01-04 DIAGNOSIS — Z9889 Other specified postprocedural states: Secondary | ICD-10-CM | POA: Insufficient documentation

## 2011-01-04 LAB — BASIC METABOLIC PANEL
BUN: 19 mg/dL (ref 6–23)
CO2: 28 mEq/L (ref 19–32)
Calcium: 10.9 mg/dL — ABNORMAL HIGH (ref 8.4–10.5)
Creatinine, Ser: 3.17 mg/dL — ABNORMAL HIGH (ref 0.50–1.10)
Glucose, Bld: 133 mg/dL — ABNORMAL HIGH (ref 70–99)

## 2011-01-04 LAB — URINALYSIS, ROUTINE W REFLEX MICROSCOPIC
Bilirubin Urine: NEGATIVE
Glucose, UA: 100 mg/dL — AB
Ketones, ur: NEGATIVE mg/dL
pH: 6 (ref 5.0–8.0)

## 2011-01-04 LAB — CBC
HCT: 34.1 % — ABNORMAL LOW (ref 36.0–46.0)
Hemoglobin: 12.2 g/dL (ref 12.0–15.0)
MCH: 33.9 pg (ref 26.0–34.0)
MCV: 94.7 fL (ref 78.0–100.0)
RBC: 3.6 MIL/uL — ABNORMAL LOW (ref 3.87–5.11)

## 2011-01-04 LAB — URINE MICROSCOPIC-ADD ON

## 2011-01-04 MED ORDER — CYANOCOBALAMIN 1000 MCG/ML IJ SOLN
1000.0000 ug | Freq: Once | INTRAMUSCULAR | Status: AC
  Administered 2011-01-04: 1000 ug via INTRAMUSCULAR

## 2011-01-04 MED ORDER — SODIUM CHLORIDE 0.9 % IV BOLUS (SEPSIS)
1000.0000 mL | Freq: Once | INTRAVENOUS | Status: AC
  Administered 2011-01-04: 1000 mL via INTRAVENOUS

## 2011-01-04 NOTE — ED Provider Notes (Signed)
History     CSN: [id number] Arrival date & time: 01/04/2011 11:55 AM   First MD Initiated Contact with Patient 01/04/11 1211      Chief Complaint  Patient presents with  . Loss of Consciousness    pt was shopping, had syncopal episode while shopping then first responder tried orthostatic vs and pt passed out again. no c/o at present.     HPI The patient today was shopping when she became lightheaded.  She had no chest pain or palpitations.  She states she was unable to find a place to sit in and having a syncopal episode that was witnessed.  EMS report they attempted to lift her and she became lightheaded again resulting in a second syncopal episode.  Patient has a long-standing history of orthostatic hypotension and she is currently on Florinef.  She's been taking her medications as instructed.  She reports no nausea vomiting or diarrhea.  She reports no fever or chills.  She denies dysuria.  She reports lightheadedness as her presyncopal symptom.  She currently has no complaints.  She denies chest pain and shortness of breath actively in the emergency department.   Past Medical History  Diagnosis Date  . Hypertension   . Diabetes mellitus   . Stroke   . Detached retina   . Blind right eye   . Fibroid     Fundal myoma    Past Surgical History  Procedure Date  . Eye surgery   . Cesarean section   . Back surgery   . Appendectomy   . Cholecystectomy   . Carotid artery blockage   . Foot surgery     Family History  Problem Relation Age of Onset  . Hypertension Father   . Diabetes Sister   . Heart disease Sister   . Diabetes Brother   . Hypertension Brother   . Heart disease Brother     History  Substance Use Topics  . Smoking status: Never Smoker   . Smokeless tobacco: Never Used  . Alcohol Use: No    OB History    Grav Para Term Preterm Abortions TAB SAB Ect Mult Living   1 1 1       1       Review of Systems  All other systems reviewed and are  negative.    Allergies  Codeine and Morphine and related  Home Medications   Current Outpatient Rx  Name Route Sig Dispense Refill  . ASPIRIN 81 MG PO TABS Oral Take 81 mg by mouth daily.      . B-100 BALANCED TR PO Oral Take 1 tablet by mouth daily.     Marland Kitchen CARVEDILOL 25 MG PO TABS  TAKE 1 TABLET TWICE A DAY  WITH A MEAL 180 tablet 1  . CLOPIDOGREL BISULFATE 75 MG PO TABS Oral Take 75 mg by mouth daily.      Marland Kitchen FLUDROCORTISONE ACETATE 0.1 MG PO TABS Oral Take 0.1-0.2 mg by mouth 2 (two) times daily. Take 2 tablets in the morning, and 1 tablet at night.     Marland Kitchen FOLIC ACID 1 MG PO TABS  TAKE 1 TABLET BY MOUTH EVERY DAY 90 tablet 3  . FUROSEMIDE 40 MG PO TABS Oral Take 40 mg by mouth daily.      . INSULIN GLARGINE 100 UNIT/ML [place] SOLN Subcutaneous Inject 25 Units into the skin daily.      Marland Kitchen LEVOTHYROXINE SODIUM 75 MCG PO TABS Oral Take 75 mcg by mouth daily.      Marland Kitchen  POTASSIUM CHLORIDE CRYS CR 20 MEQ PO TBCR Oral Take 20 mEq by mouth 2 (two) times daily. 2 tabs bid     . PROMETHAZINE HCL 12.5 MG PO TABS       . SERTRALINE HCL 100 MG PO TABS Oral Take 100 mg by mouth daily.      Marland Kitchen SIMVASTATIN 20 MG PO TABS Oral Take 20 mg by mouth at bedtime.      Marland Kitchen TRIAZOLAM 0.25 MG PO TABS Oral Take 1 tablet (0.25 mg total) by mouth at bedtime. 90 tablet 1  . ZEMPLAR 1 MCG PO CAPS Oral Take 1 tablet by mouth Daily.      BP 177/68  Pulse 57  Temp(Src) 98.5 F (36.9 C) (Oral)  Resp 15  SpO2 100%  Physical Exam  Nursing note and vitals reviewed. Constitutional: She is oriented to person, place, and time. She appears well-developed and well-nourished.  HENT:  Head: Normocephalic and atraumatic.  Eyes: Pupils are equal, round, and reactive to light.  Cardiovascular: Regular rhythm.   Pulmonary/Chest: Effort normal.  Abdominal: Soft.  Neurological: She is alert and oriented to person, place, and time.       5/5 strength in major muscle groups of  bilateral upper and lower extremities. Speech normal. No  facial asymetry.   Skin: Skin is warm and dry.  Psychiatric: She has a normal mood and affect.    ED Course  Procedures (including critical care time)   Date: 01/04/2011  Rate: 55  Rhythm: normal sinus rhythm  QRS Axis: normal  Intervals: normal  ST/T Wave abnormalities: normal  Conduction Disutrbances:none  Narrative Interpretation:   Old EKG Reviewed: No significant changes noted     Labs Reviewed  CBC - Abnormal; Notable for the following:    RBC 3.60 (*)    HCT 34.1 (*)    Platelets 145 (*)    All other components within normal limits  BASIC METABOLIC PANEL - Abnormal; Notable for the following:    Potassium 3.1 (*)    Glucose, Bld 133 (*)    Creatinine, Ser 3.17 (*)    Calcium 10.9 (*)    GFR calc non Af Amer 15 (*)    GFR calc Af Amer 17 (*)    All other components within normal limits  URINALYSIS, ROUTINE W REFLEX MICROSCOPIC - Abnormal; Notable for the following:    Glucose, UA 100 (*)    Hgb urine dipstick SMALL (*)    Protein, ur 100 (*)    Leukocytes, UA SMALL (*)    All other components within normal limits  URINE MICROSCOPIC-ADD ON - Abnormal; Notable for the following:    Squamous Epithelial / LPF MANY (*)    Bacteria, UA FEW (*)    Casts GRANULAR CAST (*) HYALINE CASTS   All other components within normal limits  TROPONIN I  URINE CULTURE   No results found.   1. Syncope   2. Orthostatic hypotension       MDM  I suspect this is orthostatic hypotension.  The patient received IV fluids in the emergency department.  Her labs are unchanged from prior including her baseline renal insufficiency.  The patient is ambulated back and forth to the restroom without difficulty.  She has no lightheadedness or syncope or near syncope on standing.  Her EKG is normal.  Doubt cardiac arrhythmia doubt stroke.  DC home with close PCP followup   3:20 PM Patient feels much better at this time.  She is  lying in bed smiling laughing with her legs crossed.  She is  ambulated to the bathroom without difficulty.  Is followup with PCP recommended and instructed to return to the ER for new or worsening symptoms       Lyanne Co, MD 01/04/11 1520

## 2011-01-04 NOTE — Progress Notes (Signed)
  Subjective:    Patient ID: [name], female    DOB: 1950-12-29, 60 y.o.   MRN: [id number]  HPI    Review of Systems     Objective:   Physical Exam        Assessment & Plan:

## 2011-01-04 NOTE — ED Notes (Signed)
Pt denies c/o. No distress. Family at bedside. Iv ns infused.pt tolerated well.

## 2011-01-06 LAB — URINE CULTURE

## 2011-01-07 ENCOUNTER — Encounter: Payer: Self-pay | Admitting: Obstetrics and Gynecology

## 2011-01-07 ENCOUNTER — Other Ambulatory Visit (HOSPITAL_COMMUNITY)
Admission: RE | Admit: 2011-01-07 | Discharge: 2011-01-07 | Disposition: A | Discharge status: Home / Self Care | Payer: Medicare Other | Source: Ambulatory Visit | Attending: Obstetrics and Gynecology | Admitting: Obstetrics and Gynecology

## 2011-01-07 ENCOUNTER — Ambulatory Visit (INDEPENDENT_AMBULATORY_CARE_PROVIDER_SITE_OTHER): Payer: Medicare Other | Admitting: Obstetrics and Gynecology

## 2011-01-07 VITALS — BP 124/76 | Ht 64.5 in | Wt 175.0 lb

## 2011-01-07 DIAGNOSIS — Z124 Encounter for screening for malignant neoplasm of cervix: Secondary | ICD-10-CM | POA: Insufficient documentation

## 2011-01-07 DIAGNOSIS — D219 Benign neoplasm of connective and other soft tissue, unspecified: Secondary | ICD-10-CM

## 2011-01-07 DIAGNOSIS — Z780 Asymptomatic menopausal state: Secondary | ICD-10-CM

## 2011-01-07 DIAGNOSIS — N951 Menopausal and female climacteric states: Secondary | ICD-10-CM

## 2011-01-07 DIAGNOSIS — N952 Postmenopausal atrophic vaginitis: Secondary | ICD-10-CM

## 2011-01-07 DIAGNOSIS — D259 Leiomyoma of uterus, unspecified: Secondary | ICD-10-CM

## 2011-01-07 NOTE — Progress Notes (Signed)
Subjective:     Patient ID: [name], female   DOB: 04/27/1950, 60 y.o.   MRN: [id number]  HPIpatient came back to see me today for further followup. Her last bone density here last year was normal. She skipped her mammogram last year. Her menopausal symptoms continue to resolve. She has a small fibroid that causes abdominal cramping but it is tolerable. She is having no vaginal bleeding. She is not sexually active. She has severe diabetes. She's been hospitalized for dehydration since I last saw her. She has urinary frequency with some urgency. She will sometimes have nocturia. She's been told by her nephrologist that she has some impairment of renal function.   Review of Systems  Constitutional:       Diabetes mellitus. Vitamin B12 deficiency.  HENT: Negative.   Eyes:       Retinal detachment  Respiratory: Negative.   Cardiovascular:       Coronary artery disease.hypertension  Gastrointestinal: Positive for nausea and vomiting.  Genitourinary:       See history of present illness  Musculoskeletal: Positive for myalgias.  Skin: Negative.   Neurological:       Previous CVA  Hematological: Negative.   Psychiatric/Behavioral:       Depression       Objective:   Physical ExamPhysical examination: Kennon Portela present G HEENT within normal limits. Neck: Thyroid not large. No masses. Supraclavicular nodes: not enlarged. Breasts: Examined in both sitting midline position. No skin changes and no masses. Abdomen: Soft no guarding rebound or masses or hernia. Pelvic: External: Within normal limits. BUS: Within normal limits. Vaginal:within normal limits. Good estrogen effect. No evidence of cystocele rectocele or enterocele. Cervix: clean. Uterus: slightly enlarged due to fibroids Adnexa: No masses. Rectovaginal exam: Confirmatory and negative. Extremities: Within normal limits.    Assessment:     #1. Fibroids with cramping #2. Menopausal symptoms #3.atrophic vaginitis      Plan:     Mammogram. RTO one year recall.

## 2011-02-01 ENCOUNTER — Ambulatory Visit (INDEPENDENT_AMBULATORY_CARE_PROVIDER_SITE_OTHER): Payer: Medicare Other | Admitting: *Deleted

## 2011-02-01 DIAGNOSIS — E538 Deficiency of other specified B group vitamins: Secondary | ICD-10-CM

## 2011-02-01 MED ORDER — CYANOCOBALAMIN 1000 MCG/ML IJ SOLN
1000.0000 ug | Freq: Once | INTRAMUSCULAR | Status: AC
  Administered 2011-02-01: 1000 ug via INTRAMUSCULAR

## 2011-02-18 IMAGING — CR DG PELVIS 1-2V
1 series · 1 of 1 positions shown · non-contrast
Comparison: None

CLINICAL DATA: Recent fall, pain in the right hip

PELVIS - 1-2 VIEW

[t pelvis a.p.]
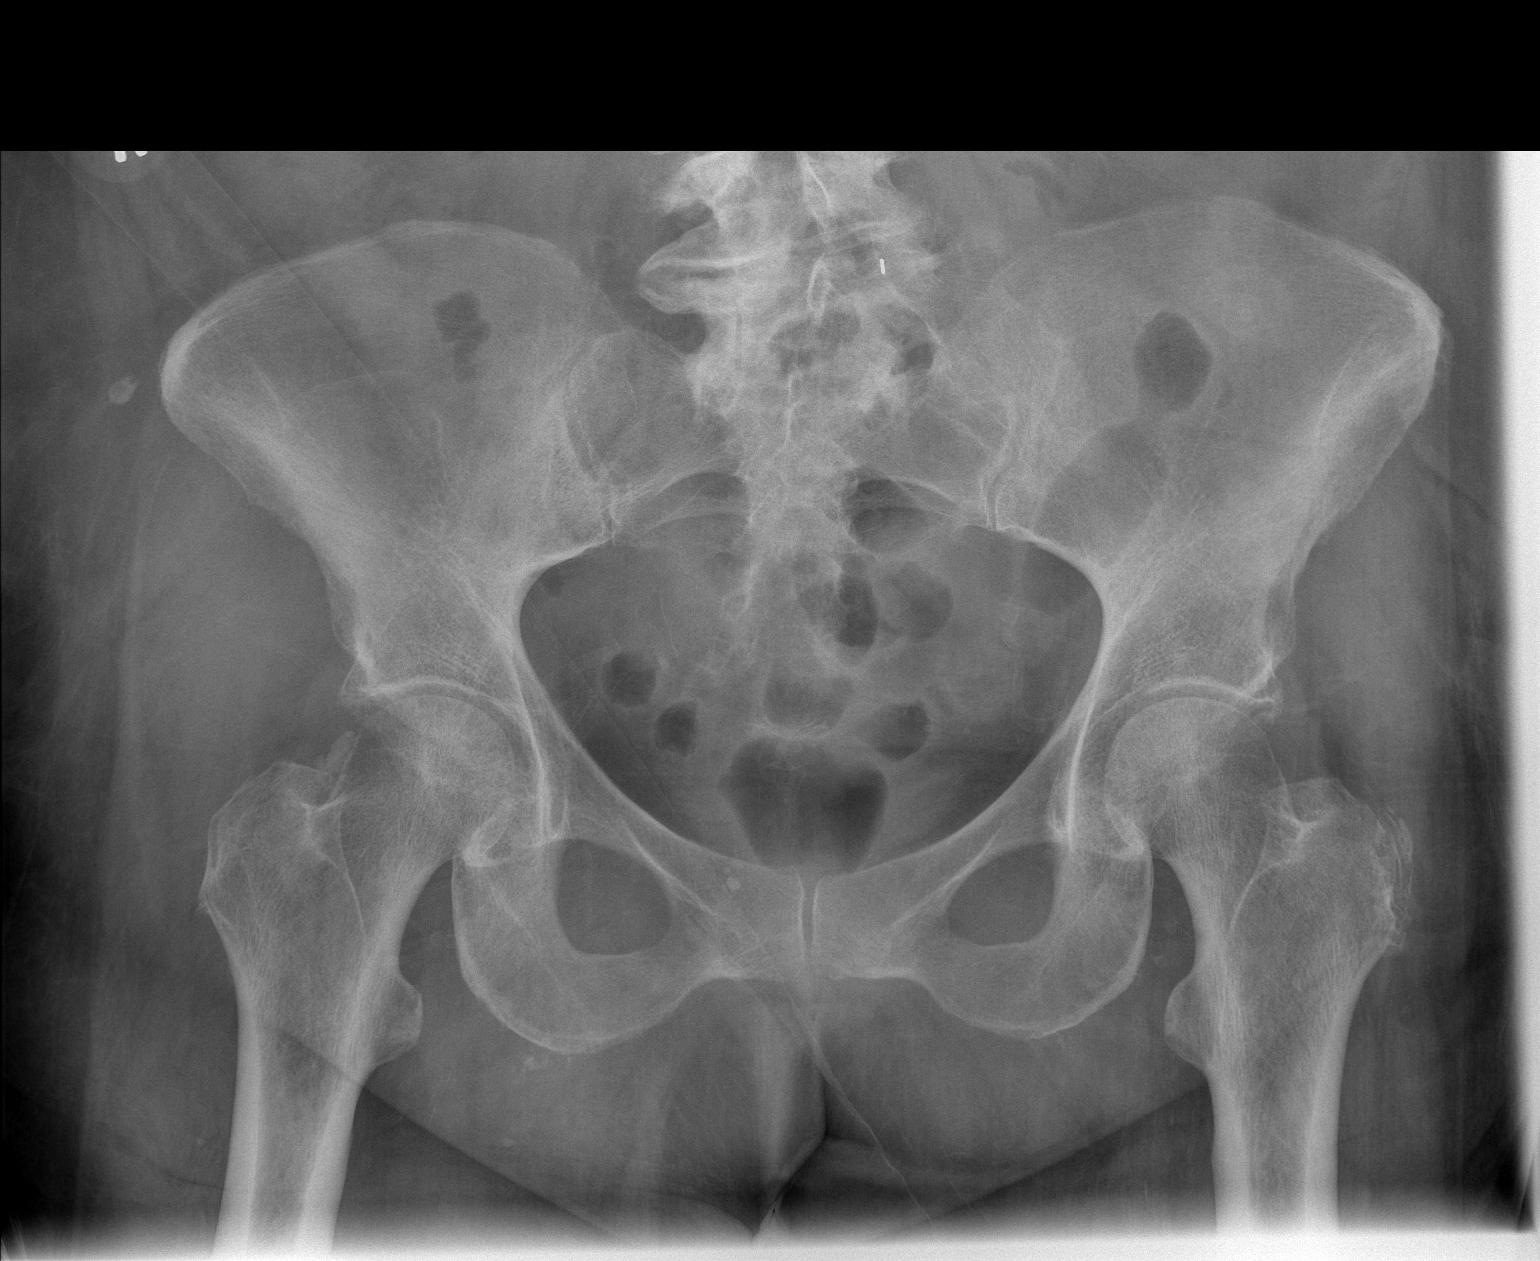

[1 of 1 positions shown; findings below may reference images not displayed]

FINDINGS: Only mild degenerative changes present in the hips.  No
acute fracture is seen.  The pelvic rami are intact.  The SI joints
appear normal.  There are degenerative changes in the lower lumbar
spine.
IMPRESSION: No acute fracture.  Degenerative change in the lower lumbar spine.

## 2011-02-18 IMAGING — CT CT HEAD W/O CM
1 of 2 series · 15 of 30 positions shown, 19 images · non-contrast
Comparison: MRI of the brain performed 01/16/2009

CLINICAL DATA: Status post fall; altered level of consciousness.
Right frontal head bruising.

CT HEAD WITHOUT CONTRAST
TECHNIQUE: Contiguous axial images were obtained from the base of
the skull through the vertex without contrast.

[Series 3: recon 2: brain · axial · 0.49mm/px · z∈[+109,+250]mm · 15 of 72 slices shown, 19 images]
[im 4/72  brain]
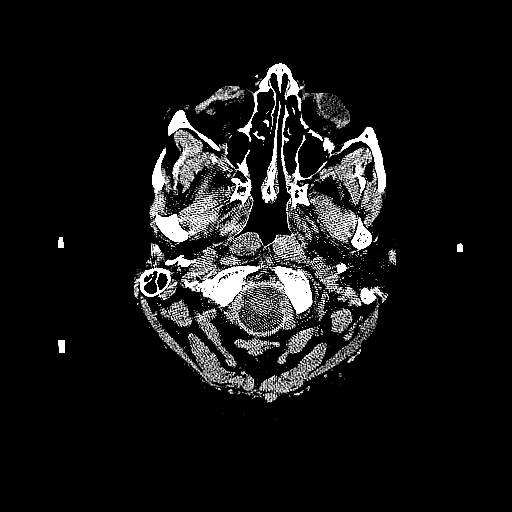
[im 4/72  bone]
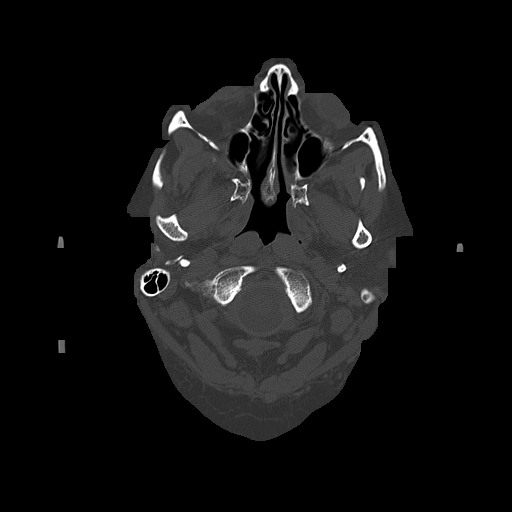
[im 8/72  brain]
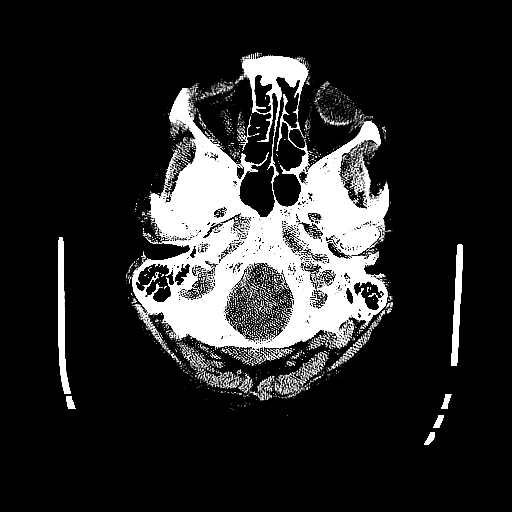
[im 15/72  brain]
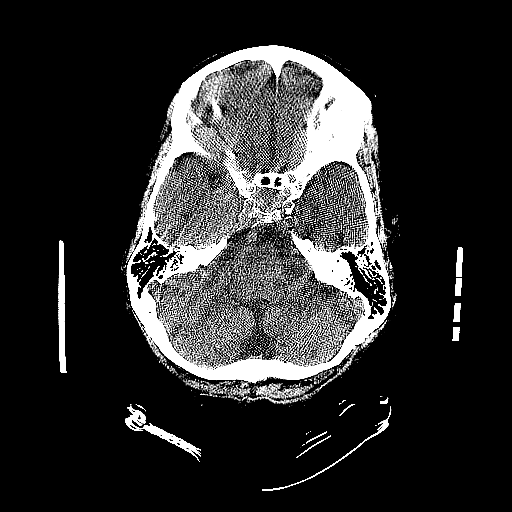
[im 19/72  brain]
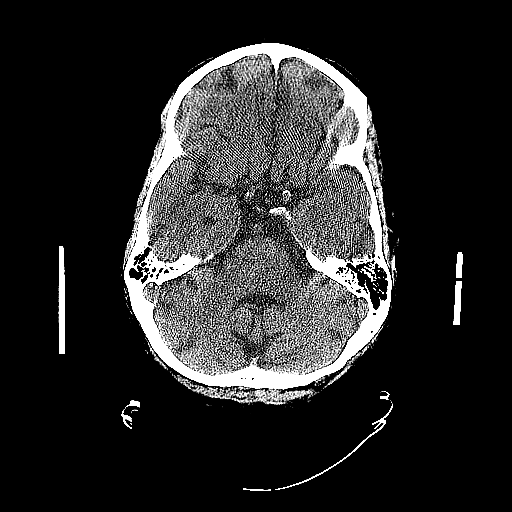
[im 23/72  brain]
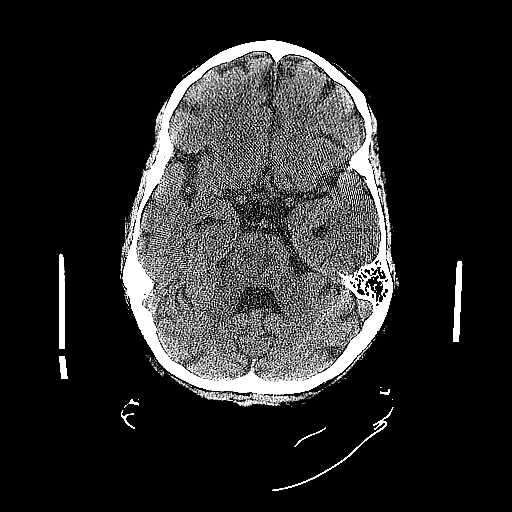
[im 23/72  bone]
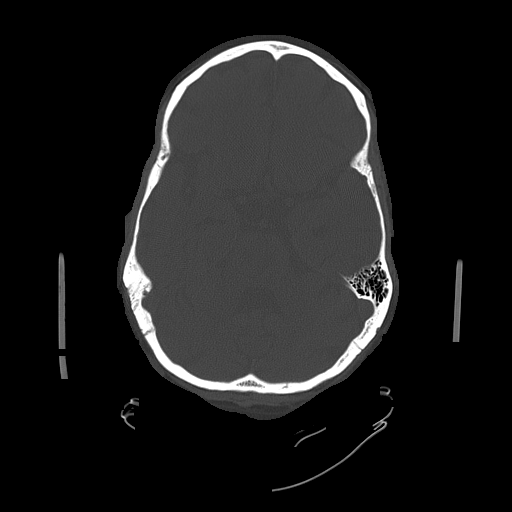
[im 27/72  brain]
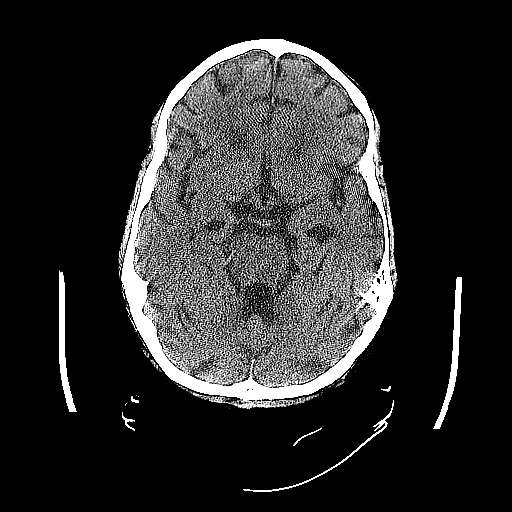
[im 30/72  brain]
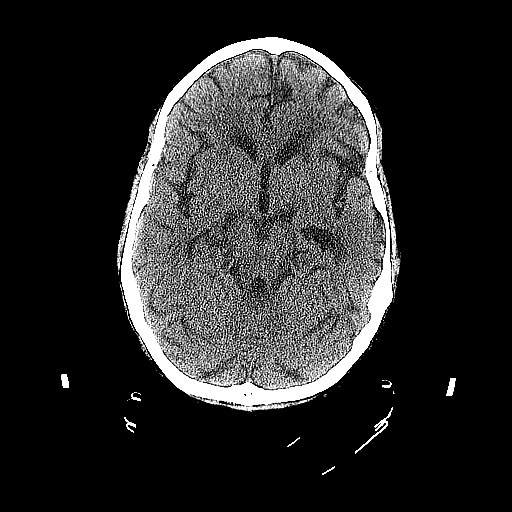
[im 38/72  brain]
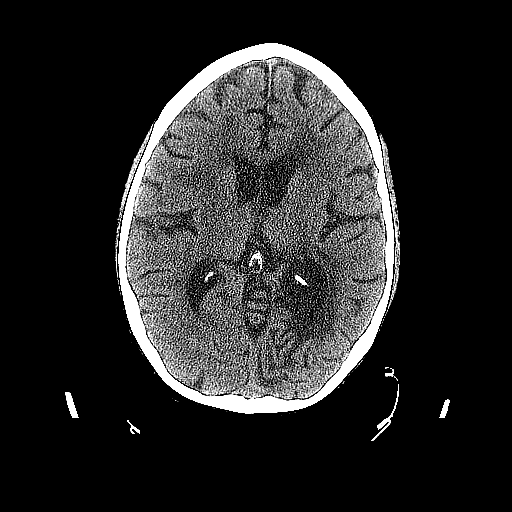
[im 42/72  brain]
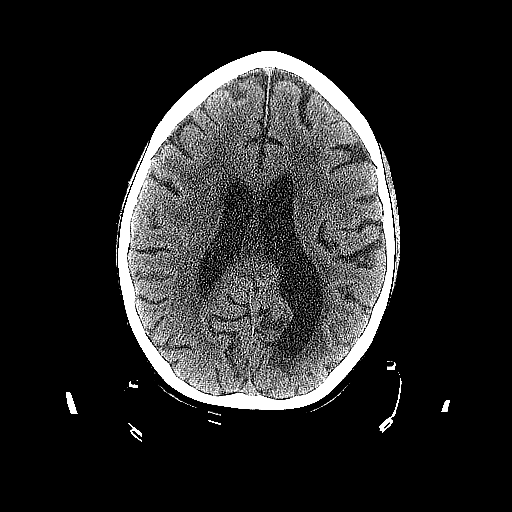
[im 42/72  bone]
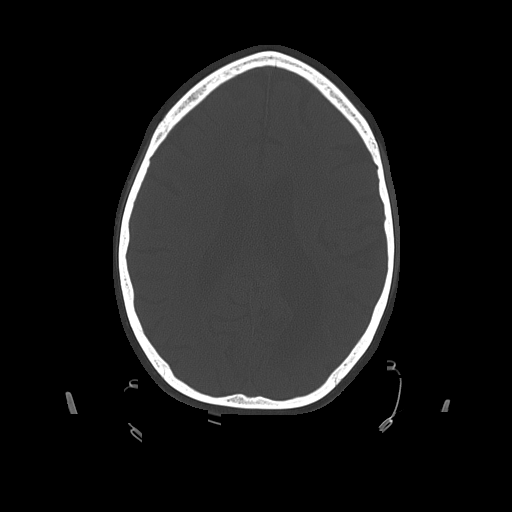
[im 45/72  brain]
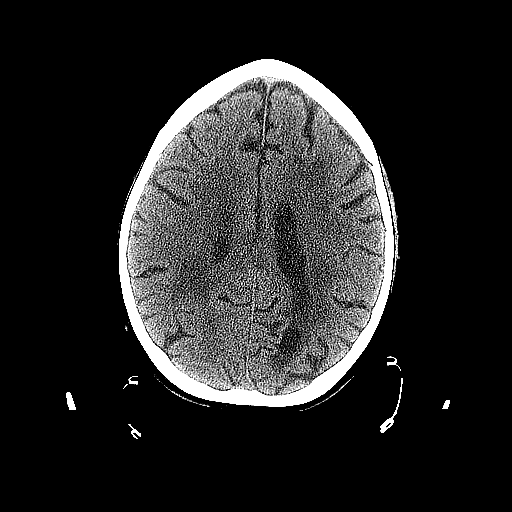
[im 49/72  brain]
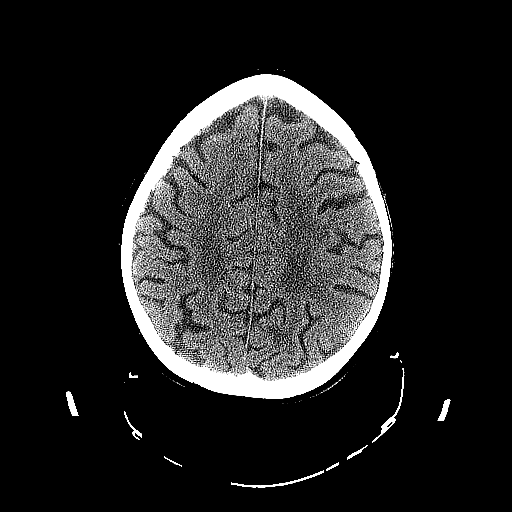
[im 53/72  brain]
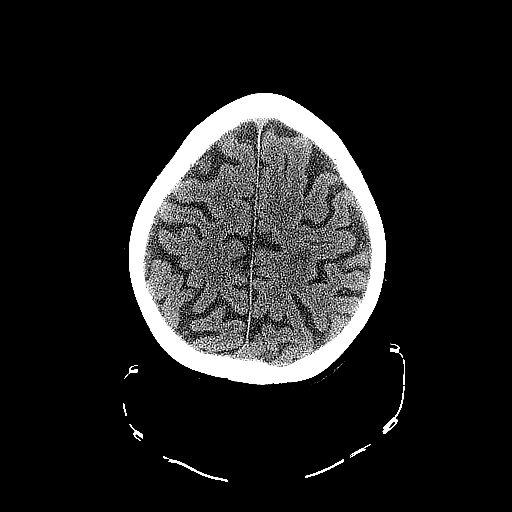
[im 60/72  brain]
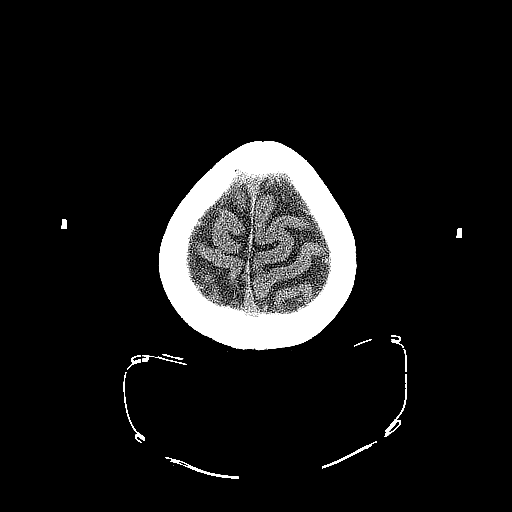
[im 60/72  bone]
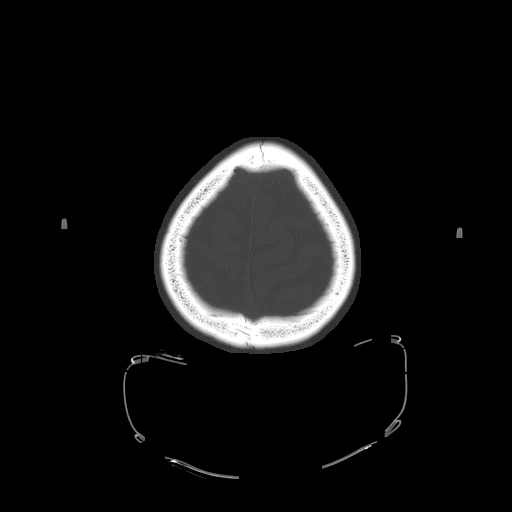
[im 64/72  brain]
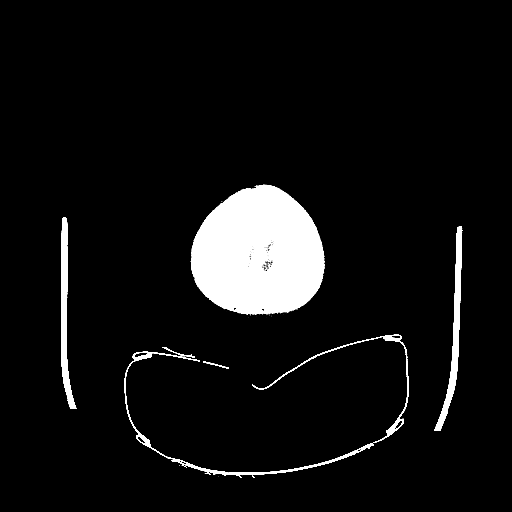
[im 68/72  brain]
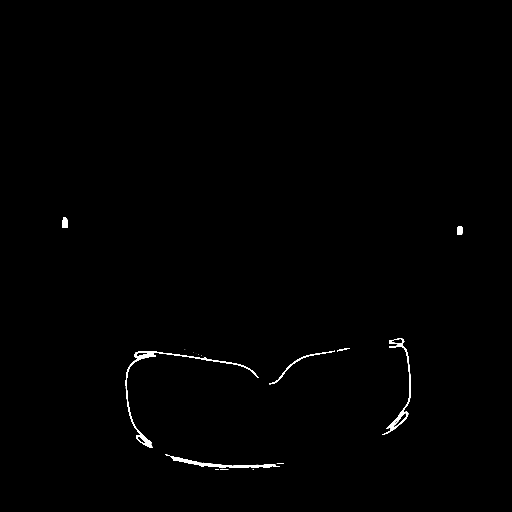

[15 of 30 positions shown; findings below may reference images not displayed]

FINDINGS: There is no evidence of acute infarction, mass lesion, or
intra- or extra-axial hemorrhage on CT.

There is stable asymmetric enlargement of the left lateral
ventricle, with surrounding white matter disease.  This appears to
reflect chronic left-sided ischemic change, with associated ex
vacuo dilatation.  No definite transependymal resorption of CSF is
noted to suggest significant hydrocephalus.  More diffuse small
vessel ischemic microangiopathy is noted bilaterally.  A chronic
infarct is seen involving the left caudate.

The ventricles are diffusely mildly prominent; this appears
relatively stable from the prior study, and is relatively
commensurate in comparison with the sulci.  No mass effect or
midline shift is seen.

There is no evidence of fracture; visualized osseous structures are
unremarkable in appearance.  There is chronic atrophy of the right
globe, with minimal calcification.  The orbits are otherwise
unremarkable in appearance.  The paranasal sinuses and mastoid air
cells are well-aerated.  No significant soft tissue abnormalities
are seen.
IMPRESSION: 1.  No evidence of traumatic intracranial injury or fracture.
2.  Stable asymmetric enlargement of the left lateral ventricle,
with surrounding white matter disease; this appears to reflect
chronic left-sided ischemic change, without definite hydrocephalus.
Chronic infarct involving the left caudate.  Diffuse small vessel
ischemic microangiopathy noted.

## 2011-02-22 ENCOUNTER — Other Ambulatory Visit: Payer: Self-pay | Admitting: *Deleted

## 2011-02-22 NOTE — Telephone Encounter (Signed)
Refill request for fludrocortisone 0.1mg .

## 2011-02-22 NOTE — Telephone Encounter (Signed)
Done

## 2011-02-24 ENCOUNTER — Other Ambulatory Visit: Payer: Self-pay | Admitting: Internal Medicine

## 2011-02-25 ENCOUNTER — Other Ambulatory Visit: Payer: Self-pay | Admitting: *Deleted

## 2011-02-25 MED ORDER — FLUDROCORTISONE ACETATE 0.1 MG PO TABS: 0.2000 mg | ORAL_TABLET | Freq: Two times a day (BID) | ORAL | Status: DC

## 2011-02-26 ENCOUNTER — Other Ambulatory Visit: Payer: Self-pay | Admitting: Internal Medicine

## 2011-03-04 ENCOUNTER — Ambulatory Visit (INDEPENDENT_AMBULATORY_CARE_PROVIDER_SITE_OTHER): Payer: Medicare Other

## 2011-03-04 DIAGNOSIS — E538 Deficiency of other specified B group vitamins: Secondary | ICD-10-CM

## 2011-03-04 MED ORDER — CYANOCOBALAMIN 1000 MCG/ML IJ SOLN
1000.0000 ug | Freq: Once | INTRAMUSCULAR | Status: AC
  Administered 2011-03-04: 1000 ug via INTRAMUSCULAR

## 2011-03-20 ENCOUNTER — Other Ambulatory Visit: Payer: Self-pay | Admitting: *Deleted

## 2011-03-20 MED ORDER — SERTRALINE HCL 100 MG PO TABS: 100.0000 mg | ORAL_TABLET | Freq: Every day | ORAL | Status: DC

## 2011-03-20 MED ORDER — CLOPIDOGREL BISULFATE 75 MG PO TABS: 75.0000 mg | ORAL_TABLET | Freq: Every day | ORAL | Status: DC

## 2011-03-20 MED ORDER — LEVOTHYROXINE SODIUM 75 MCG PO TABS: 75.0000 ug | ORAL_TABLET | Freq: Every day | ORAL | Status: DC

## 2011-03-20 MED ORDER — SIMVASTATIN 20 MG PO TABS: 20.0000 mg | ORAL_TABLET | Freq: Every day | ORAL | Status: DC

## 2011-03-20 NOTE — Telephone Encounter (Signed)
Drop of request need Triazolam 0.25mg  take 1 at bedtime sent to [facility]. Is this ok?... 03/20/11@5 :24pm/LMB

## 2011-03-20 NOTE — Telephone Encounter (Signed)
Drop off letter requesting refills on clopidogrel, synthroid, Triazolam, simvastatin, and sertraline sent to [facility]..... 03/20/11@5 :20pm/LMB

## 2011-03-21 MED ORDER — TRIAZOLAM 0.25 MG PO TABS: 0.2500 mg | ORAL_TABLET | Freq: Every day | ORAL | Status: DC

## 2011-03-21 NOTE — Telephone Encounter (Signed)
Addended by: Deatra James on: 03/21/2011 09:03 AM   Modules accepted: Orders

## 2011-03-21 NOTE — Telephone Encounter (Signed)
Faxed script for Triazolam to [facility]... 03/21/11@9 :45am/LMB

## 2011-04-04 ENCOUNTER — Ambulatory Visit (INDEPENDENT_AMBULATORY_CARE_PROVIDER_SITE_OTHER): Payer: Medicare Other | Admitting: *Deleted

## 2011-04-04 DIAGNOSIS — E538 Deficiency of other specified B group vitamins: Secondary | ICD-10-CM

## 2011-04-04 MED ORDER — CYANOCOBALAMIN 1000 MCG/ML IJ SOLN
1000.0000 ug | Freq: Once | INTRAMUSCULAR | Status: AC
  Administered 2011-04-04: 1000 ug via INTRAMUSCULAR

## 2011-04-09 ENCOUNTER — Other Ambulatory Visit: Payer: Self-pay | Admitting: *Deleted

## 2011-04-09 MED ORDER — LEVOTHYROXINE SODIUM 75 MCG PO TABS: 75.0000 ug | ORAL_TABLET | Freq: Every day | ORAL | Status: DC

## 2011-04-27 ENCOUNTER — Other Ambulatory Visit: Payer: Self-pay | Admitting: Internal Medicine

## 2011-04-29 NOTE — Telephone Encounter (Signed)
Done

## 2011-05-02 ENCOUNTER — Ambulatory Visit (INDEPENDENT_AMBULATORY_CARE_PROVIDER_SITE_OTHER): Payer: Medicare Other | Admitting: *Deleted

## 2011-05-02 DIAGNOSIS — E538 Deficiency of other specified B group vitamins: Secondary | ICD-10-CM

## 2011-05-02 MED ORDER — CYANOCOBALAMIN 1000 MCG/ML IJ SOLN
1000.0000 ug | Freq: Once | INTRAMUSCULAR | Status: AC
  Administered 2011-05-02: 1000 ug via INTRAMUSCULAR

## 2011-05-31 ENCOUNTER — Ambulatory Visit (INDEPENDENT_AMBULATORY_CARE_PROVIDER_SITE_OTHER): Payer: Medicare Other | Admitting: *Deleted

## 2011-05-31 DIAGNOSIS — E538 Deficiency of other specified B group vitamins: Secondary | ICD-10-CM

## 2011-05-31 MED ORDER — CYANOCOBALAMIN 1000 MCG/ML IJ SOLN
1000.0000 ug | Freq: Once | INTRAMUSCULAR | Status: AC
  Administered 2011-05-31: 1000 ug via INTRAMUSCULAR

## 2011-06-17 ENCOUNTER — Other Ambulatory Visit (HOSPITAL_COMMUNITY): Payer: Self-pay | Admitting: *Deleted

## 2011-06-19 ENCOUNTER — Encounter (HOSPITAL_COMMUNITY)
Admission: RE | Admit: 2011-06-19 | Discharge: 2011-06-19 | Disposition: A | Discharge status: Home / Self Care | Payer: Medicare Other | Source: Ambulatory Visit | Attending: Nephrology | Admitting: Nephrology

## 2011-06-19 DIAGNOSIS — N184 Chronic kidney disease, stage 4 (severe): Secondary | ICD-10-CM | POA: Insufficient documentation

## 2011-06-19 DIAGNOSIS — D649 Anemia, unspecified: Secondary | ICD-10-CM | POA: Insufficient documentation

## 2011-06-19 LAB — RENAL FUNCTION PANEL
Albumin: 3.6 g/dL (ref 3.5–5.2)
CO2: 28 mEq/L (ref 19–32)
Calcium: 10.9 mg/dL — ABNORMAL HIGH (ref 8.4–10.5)
Creatinine, Ser: 3.16 mg/dL — ABNORMAL HIGH (ref 0.50–1.10)
GFR calc non Af Amer: 15 mL/min — ABNORMAL LOW (ref >90)
Phosphorus: 3 mg/dL (ref 2.3–4.6)
Sodium: 144 mEq/L (ref 135–145)

## 2011-06-19 LAB — IRON AND TIBC: Saturation Ratios: 28 % (ref 20–55)

## 2011-06-19 LAB — POCT HEMOGLOBIN-HEMACUE: Hemoglobin: 10.9 g/dL — ABNORMAL LOW (ref 12.0–15.0)

## 2011-06-19 MED ORDER — EPOETIN ALFA 20000 UNIT/ML IJ SOLN
20000.0000 [IU] | INTRAMUSCULAR | Status: DC
  Administered 2011-06-19: 20000 [IU] via SUBCUTANEOUS
  Filled 2011-06-19: qty 1

## 2011-06-20 LAB — PTH, INTACT AND CALCIUM
Calcium, Total (PTH): 10.6 mg/dL — ABNORMAL HIGH (ref 8.4–10.5)
PTH: 253.2 pg/mL — ABNORMAL HIGH (ref 14.0–72.0)

## 2011-06-28 ENCOUNTER — Other Ambulatory Visit: Payer: Self-pay | Admitting: Internal Medicine

## 2011-07-01 ENCOUNTER — Other Ambulatory Visit: Payer: Self-pay | Admitting: Internal Medicine

## 2011-07-01 ENCOUNTER — Ambulatory Visit (INDEPENDENT_AMBULATORY_CARE_PROVIDER_SITE_OTHER): Payer: Medicare Other | Admitting: *Deleted

## 2011-07-01 DIAGNOSIS — E538 Deficiency of other specified B group vitamins: Secondary | ICD-10-CM

## 2011-07-01 MED ORDER — CYANOCOBALAMIN 1000 MCG/ML IJ SOLN
1000.0000 ug | Freq: Once | INTRAMUSCULAR | Status: AC
  Administered 2011-07-01: 1000 ug via INTRAMUSCULAR

## 2011-07-15 ENCOUNTER — Ambulatory Visit: Payer: Medicare Other | Admitting: Internal Medicine

## 2011-07-15 DIAGNOSIS — Z0289 Encounter for other administrative examinations: Secondary | ICD-10-CM

## 2011-07-31 ENCOUNTER — Ambulatory Visit (INDEPENDENT_AMBULATORY_CARE_PROVIDER_SITE_OTHER): Payer: Medicare Other | Admitting: *Deleted

## 2011-07-31 ENCOUNTER — Encounter (HOSPITAL_COMMUNITY)
Admission: RE | Admit: 2011-07-31 | Discharge: 2011-07-31 | Disposition: A | Discharge status: Home / Self Care | Payer: Medicare Other | Source: Ambulatory Visit | Attending: Nephrology | Admitting: Nephrology

## 2011-07-31 DIAGNOSIS — E538 Deficiency of other specified B group vitamins: Secondary | ICD-10-CM

## 2011-07-31 DIAGNOSIS — N184 Chronic kidney disease, stage 4 (severe): Secondary | ICD-10-CM | POA: Insufficient documentation

## 2011-07-31 DIAGNOSIS — D649 Anemia, unspecified: Secondary | ICD-10-CM | POA: Insufficient documentation

## 2011-07-31 LAB — RENAL FUNCTION PANEL
Albumin: 3.6 g/dL (ref 3.5–5.2)
BUN: 23 mg/dL (ref 6–23)
GFR calc non Af Amer: 14 mL/min — ABNORMAL LOW (ref >90)
Phosphorus: 3.3 mg/dL (ref 2.3–4.6)
Potassium: 2.9 mEq/L — ABNORMAL LOW (ref 3.5–5.1)

## 2011-07-31 LAB — POCT HEMOGLOBIN-HEMACUE: Hemoglobin: 10.3 g/dL — ABNORMAL LOW (ref 12.0–15.0)

## 2011-07-31 LAB — FERRITIN: Ferritin: 105 ng/mL (ref 10–291)

## 2011-07-31 LAB — IRON AND TIBC: Iron: 75 ug/dL (ref 42–135)

## 2011-07-31 MED ORDER — CYANOCOBALAMIN 1000 MCG/ML IJ SOLN
1000.0000 ug | Freq: Once | INTRAMUSCULAR | Status: AC
  Administered 2011-07-31: 1000 ug via INTRAMUSCULAR

## 2011-07-31 MED ORDER — EPOETIN ALFA 20000 UNIT/ML IJ SOLN
20000.0000 [IU] | INTRAMUSCULAR | Status: DC
  Administered 2011-07-31: 20000 [IU] via SUBCUTANEOUS

## 2011-07-31 MED ORDER — EPOETIN ALFA 20000 UNIT/ML IJ SOLN
INTRAMUSCULAR | Status: AC
  Filled 2011-07-31: qty 1

## 2011-08-01 ENCOUNTER — Ambulatory Visit: Payer: Medicare Other

## 2011-08-01 LAB — PTH, INTACT AND CALCIUM: PTH: 245.5 pg/mL — ABNORMAL HIGH (ref 14.0–72.0)

## 2011-08-16 ENCOUNTER — Other Ambulatory Visit: Payer: Self-pay | Admitting: Internal Medicine

## 2011-08-17 ENCOUNTER — Other Ambulatory Visit: Payer: Self-pay | Admitting: Internal Medicine

## 2011-08-19 ENCOUNTER — Other Ambulatory Visit: Payer: Self-pay | Admitting: Internal Medicine

## 2011-08-19 MED ORDER — INSULIN GLARGINE 100 UNIT/ML ~~LOC~~ SOLN: 25.0000 [IU] | Freq: Every day | SUBCUTANEOUS | Status: DC

## 2011-08-30 ENCOUNTER — Ambulatory Visit: Payer: Medicare Other

## 2011-09-11 ENCOUNTER — Encounter (HOSPITAL_COMMUNITY)
Admission: RE | Admit: 2011-09-11 | Discharge: 2011-09-11 | Disposition: A | Discharge status: Home / Self Care | Payer: Medicare Other | Source: Ambulatory Visit | Attending: Nephrology | Admitting: Nephrology

## 2011-09-11 DIAGNOSIS — D649 Anemia, unspecified: Secondary | ICD-10-CM | POA: Insufficient documentation

## 2011-09-11 DIAGNOSIS — N184 Chronic kidney disease, stage 4 (severe): Secondary | ICD-10-CM | POA: Insufficient documentation

## 2011-09-11 LAB — RENAL FUNCTION PANEL
Albumin: 3.6 g/dL (ref 3.5–5.2)
BUN: 26 mg/dL — ABNORMAL HIGH (ref 6–23)
Chloride: 108 mEq/L (ref 96–112)
GFR calc Af Amer: 15 mL/min — ABNORMAL LOW (ref >90)
GFR calc non Af Amer: 13 mL/min — ABNORMAL LOW (ref >90)
Potassium: 4 mEq/L (ref 3.5–5.1)

## 2011-09-11 MED ORDER — EPOETIN ALFA 20000 UNIT/ML IJ SOLN: 20000.0000 [IU] | INTRAMUSCULAR | Status: DC

## 2011-09-12 LAB — IRON AND TIBC
Iron: 66 ug/dL (ref 42–135)
TIBC: 278 ug/dL (ref 250–470)

## 2011-09-15 ENCOUNTER — Other Ambulatory Visit: Payer: Self-pay | Admitting: Internal Medicine

## 2011-09-20 ENCOUNTER — Emergency Department (HOSPITAL_COMMUNITY)
Admission: EM | Admit: 2011-09-20 | Discharge: 2011-09-21 | Disposition: A | Discharge status: Home / Self Care | Payer: Medicare Other | Attending: Emergency Medicine | Admitting: Emergency Medicine

## 2011-09-20 ENCOUNTER — Encounter (HOSPITAL_COMMUNITY): Payer: Self-pay | Admitting: Emergency Medicine

## 2011-09-20 DIAGNOSIS — S91119A Laceration without foreign body of unspecified toe without damage to nail, initial encounter: Secondary | ICD-10-CM

## 2011-09-20 DIAGNOSIS — X58XXXA Exposure to other specified factors, initial encounter: Secondary | ICD-10-CM | POA: Insufficient documentation

## 2011-09-20 DIAGNOSIS — Z23 Encounter for immunization: Secondary | ICD-10-CM | POA: Insufficient documentation

## 2011-09-20 DIAGNOSIS — S91109A Unspecified open wound of unspecified toe(s) without damage to nail, initial encounter: Secondary | ICD-10-CM | POA: Insufficient documentation

## 2011-09-20 NOTE — ED Notes (Signed)
Per EMS pt fell earlier today and cut the bottom of her right foot at the base of her pinky toe   Tonight pt fell again and had difficulty getting up  EMS were called out and transported for evaluation

## 2011-09-21 ENCOUNTER — Emergency Department (HOSPITAL_COMMUNITY): Payer: Medicare Other

## 2011-09-21 MED ORDER — TRAMADOL HCL 50 MG PO TABS: 50.0000 mg | ORAL_TABLET | Freq: Four times a day (QID) | ORAL | Status: AC | PRN

## 2011-09-21 MED ORDER — TETANUS-DIPHTH-ACELL PERTUSSIS 5-2.5-18.5 LF-MCG/0.5 IM SUSP
0.5000 mL | Freq: Once | INTRAMUSCULAR | Status: AC
  Administered 2011-09-21: 0.5 mL via INTRAMUSCULAR
  Filled 2011-09-21: qty 0.5

## 2011-09-21 NOTE — ED Notes (Signed)
Received pt to room from triage room

## 2011-09-21 NOTE — ED Notes (Signed)
Patient transported to X-ray 

## 2011-09-21 NOTE — ED Notes (Signed)
Pt fell today at approximately 2 pm and she cut her pinkie toe on metal threshold,  Per family at bedside and pt her toe has continued to bleed throughout day,  Pt's bandage changed and foot reassessed  There is a cut on bottom of toe and bleeding,  Foot is bruised on top,  Pt denies hitting head or LOC. Pt is alert and oriented per norm

## 2011-09-21 NOTE — ED Notes (Signed)
Pt states that she feels "light-headed" and family expresses concern about pt's foot bleeding so much. BLE elevated and BP/HR checked

## 2011-09-26 ENCOUNTER — Encounter (HOSPITAL_COMMUNITY): Payer: Medicare Other

## 2011-09-26 ENCOUNTER — Encounter: Payer: Self-pay | Admitting: Internal Medicine

## 2011-09-26 ENCOUNTER — Ambulatory Visit (INDEPENDENT_AMBULATORY_CARE_PROVIDER_SITE_OTHER): Payer: Medicare Other | Admitting: Internal Medicine

## 2011-09-26 ENCOUNTER — Other Ambulatory Visit (INDEPENDENT_AMBULATORY_CARE_PROVIDER_SITE_OTHER): Payer: Medicare Other

## 2011-09-26 VITALS — BP 138/82 | HR 64 | Temp 97.4°F | Resp 16

## 2011-09-26 DIAGNOSIS — N184 Chronic kidney disease, stage 4 (severe): Secondary | ICD-10-CM

## 2011-09-26 DIAGNOSIS — E785 Hyperlipidemia, unspecified: Secondary | ICD-10-CM

## 2011-09-26 DIAGNOSIS — I10 Essential (primary) hypertension: Secondary | ICD-10-CM

## 2011-09-26 DIAGNOSIS — R112 Nausea with vomiting, unspecified: Secondary | ICD-10-CM

## 2011-09-26 DIAGNOSIS — E119 Type 2 diabetes mellitus without complications: Secondary | ICD-10-CM

## 2011-09-26 DIAGNOSIS — E538 Deficiency of other specified B group vitamins: Secondary | ICD-10-CM

## 2011-09-26 DIAGNOSIS — S92909A Unspecified fracture of unspecified foot, initial encounter for closed fracture: Secondary | ICD-10-CM

## 2011-09-26 DIAGNOSIS — S92901A Unspecified fracture of right foot, initial encounter for closed fracture: Secondary | ICD-10-CM | POA: Insufficient documentation

## 2011-09-26 LAB — URINALYSIS, ROUTINE W REFLEX MICROSCOPIC
Nitrite: NEGATIVE
Specific Gravity, Urine: 1.02 (ref 1.000–1.030)
Total Protein, Urine: 30
pH: 6.5 (ref 5.0–8.0)

## 2011-09-26 LAB — LIPID PANEL
Cholesterol: 153 mg/dL (ref 0–200)
HDL: 63.5 mg/dL (ref >39.00)
LDL Cholesterol: 75 mg/dL (ref 0–99)
Total CHOL/HDL Ratio: 2
Triglycerides: 75 mg/dL (ref 0.0–149.0)
VLDL: 15 mg/dL (ref 0.0–40.0)

## 2011-09-26 LAB — COMPREHENSIVE METABOLIC PANEL
Albumin: 3.8 g/dL (ref 3.5–5.2)
Alkaline Phosphatase: 61 U/L (ref 39–117)
BUN: 26 mg/dL — ABNORMAL HIGH (ref 6–23)
CO2: 30 mEq/L (ref 19–32)
GFR: 14.72 mL/min — CL (ref >60.00)
Glucose, Bld: 144 mg/dL — ABNORMAL HIGH (ref 70–99)
Total Bilirubin: 1.1 mg/dL (ref 0.3–1.2)

## 2011-09-26 LAB — CBC WITH DIFFERENTIAL/PLATELET
Basophils Relative: 0.4 % (ref 0.0–3.0)
Eosinophils Relative: 1.5 % (ref 0.0–5.0)
MCV: 97.2 fl (ref 78.0–100.0)
Monocytes Absolute: 0.3 10*3/uL (ref 0.1–1.0)
Monocytes Relative: 4.4 % (ref 3.0–12.0)
Neutrophils Relative %: 77.8 % — ABNORMAL HIGH (ref 43.0–77.0)
RBC: 3.54 Mil/uL — ABNORMAL LOW (ref 3.87–5.11)
WBC: 7 10*3/uL (ref 4.5–10.5)

## 2011-09-26 MED ORDER — PROMETHAZINE HCL 25 MG/ML IJ SOLN: 25.0000 mg | Freq: Once | INTRAMUSCULAR | Status: DC

## 2011-09-26 NOTE — Patient Instructions (Signed)

## 2011-09-26 NOTE — Progress Notes (Signed)
Subjective:    Patient ID: [name], female    DOB: Oct 29, 1950, 61 y.o.   MRN: [id number]  Diabetes She presents for her follow-up diabetic visit. She has type 2 diabetes mellitus. Hypoglycemia symptoms include confusion (husband complains that she gets up at night and wanders). Pertinent negatives for hypoglycemia include no dizziness, headaches, nervousness/anxiousness, pallor, seizures, speech difficulty or tremors. Associated symptoms include fatigue and polyuria. Pertinent negatives for diabetes include no blurred vision, no chest pain, no foot paresthesias, no foot ulcerations, no polydipsia, no polyphagia, no visual change, no weakness and no weight loss. There are no hypoglycemic complications. Diabetic complications include autonomic neuropathy and nephropathy. Current diabetic treatment includes intensive insulin program and insulin injections. She is compliant with treatment most of the time. Her weight is stable. She is following a generally unhealthy diet. When asked about meal planning, she reported none. She has not had a previous visit with a dietician. She never participates in exercise. There is no change in her home blood glucose trend. An ACE inhibitor/angiotensin II receptor blocker is contraindicated.      Review of Systems  Constitutional: Positive for fatigue. Negative for fever, chills, weight loss, diaphoresis, activity change, appetite change and unexpected weight change.  HENT: Negative.   Eyes: Negative.  Negative for blurred vision.  Respiratory: Negative for cough, chest tightness, shortness of breath, wheezing and stridor.   Cardiovascular: Negative for chest pain and palpitations.  Gastrointestinal: Positive for nausea (chronic, recurrent problem). Negative for vomiting, abdominal pain, diarrhea, constipation, blood in stool, abdominal distention, anal bleeding and rectal pain.  Genitourinary: Positive for polyuria and frequency. Negative for dysuria,  urgency, hematuria, flank pain, decreased urine volume, enuresis and difficulty urinating.  Musculoskeletal: Negative for myalgias, back pain, joint swelling, arthralgias and gait problem.  Skin: Negative for color change, pallor, rash and wound.  Neurological: Negative for dizziness, tremors, seizures, syncope, facial asymmetry, speech difficulty, weakness, light-headedness, numbness and headaches.  Hematological: Negative for polydipsia, polyphagia and adenopathy. Does not bruise/bleed easily.  Psychiatric/Behavioral: Positive for behavioral problems, confusion (husband complains that she gets up at night and wanders), disturbed wake/sleep cycle and decreased concentration. Negative for suicidal ideas, hallucinations, self-injury, dysphoric mood and agitation. The patient is not nervous/anxious and is not hyperactive.        Objective:   Physical Exam  Vitals reviewed. Constitutional: She is oriented to person, place, and time. She appears well-developed and well-nourished.  Non-toxic appearance. She does not have a sickly appearance. She does not appear ill. No distress.  HENT:  Head: Normocephalic and atraumatic.  Mouth/Throat: Oropharynx is clear and moist. No oropharyngeal exudate.  Eyes: Conjunctivae are normal. Right eye exhibits no discharge. Left eye exhibits no discharge. No scleral icterus.  Neck: Normal range of motion. Neck supple. No JVD present. No tracheal deviation present. No thyromegaly present.  Cardiovascular: Normal rate, regular rhythm, normal heart sounds and intact distal pulses.  Exam reveals no gallop and no friction rub.   No murmur heard. Pulmonary/Chest: Effort normal and breath sounds normal. No stridor. No respiratory distress. She has no wheezes. She has no rales. She exhibits no tenderness.  Abdominal: Soft. Bowel sounds are normal. She exhibits no distension and no mass. There is no tenderness. There is no rebound and no guarding.  Musculoskeletal: Normal  range of motion. She exhibits no edema and no tenderness.       Feet:       Right foot shows a blue ecchymosis with swelling and a healing/closed  laceration on the tight foot under the 5th toe  Lymphadenopathy:    She has no cervical adenopathy.  Neurological: She is alert and oriented to person, place, and time. She has normal reflexes. She displays normal reflexes. No cranial nerve deficit. Coordination normal.  Skin: She is not diaphoretic.  Psychiatric: Judgment and thought content normal. Her mood appears not anxious. Her affect is not angry, not blunt, not labile and not inappropriate. Her speech is rapid and/or pressured and delayed. Her speech is not tangential and not slurred. She is slowed and withdrawn. She is not agitated, not aggressive, is not hyperactive, not actively hallucinating and not combative. Cognition and memory are normal. She does not exhibit a depressed mood. She is communicative. She is inattentive.     Lab Results  Component Value Date   WBC 9.1 01/04/2011   HGB 12.4 09/11/2011   HCT 34.1* 01/04/2011   PLT 145* 01/04/2011   GLUCOSE 139* 09/11/2011   CHOL  Value: 151        ATP III CLASSIFICATION:  <200     mg/dL   Desirable  161-096  mg/dL   Borderline High  >=045    mg/dL   High        05/20/8117   TRIG 114 09/08/2009   HDL 42 09/08/2009   LDLCALC  Value: 86        Total Cholesterol/HDL:CHD Risk Coronary Heart Disease Risk Table                     Men   Women  1/2 Average Risk   3.4   3.3  Average Risk       5.0   4.4  2 X Average Risk   9.6   7.1  3 X Average Risk  23.4   11.0        Use the calculated Patient Ratio above and the CHD Risk Table to determine the patient's CHD Risk.        ATP III CLASSIFICATION (LDL):  <100     mg/dL   Optimal  147-829  mg/dL   Near or Above                    Optimal  130-159  mg/dL   Borderline  562-130  mg/dL   High  >865     mg/dL   Very High 7/84/6962   ALT 19 09/29/2009   AST 27 09/29/2009   NA 143 09/11/2011   K 4.0 09/11/2011     CL 108 09/11/2011   CREATININE 3.44* 09/11/2011   BUN 26* 09/11/2011   CO2 26 09/11/2011   TSH 0.961 09/07/2009   INR 1.08 09/28/2009   HGBA1C  Value: 5.3 (NOTE)                                                                       According to the ADA Clinical Practice Recommendations for 2011, when HbA1c is used as a screening test:   >=6.5%   Diagnostic of Diabetes Mellitus           (if abnormal result  is confirmed)  5.7-6.4%   Increased risk of developing Diabetes Mellitus  References:Diagnosis and Classification of  Diabetes Mellitus,Diabetes Care,2011,34(Suppl 1):S62-S69 and Standards of Medical Care in         Diabetes - 2011,Diabetes Care,2011,34  (Suppl 1):S11-S61. 09/07/2009   MICROALBUR 4.5* 04/27/2008   Dg Toe 5th Right  09/21/2011  *RADIOLOGY REPORT*  Clinical Data: Right fifth toe injury.  RIGHT FIFTH TOE  Comparison: None.  Findings: Suboptimal radiograph due to limitations in positioning. There is contour deformity with lateral angulation of the fourth and fifth metatarsals distally.  There is a linear lucency through the base of the fifth proximal phalanx and fragmentation along the medial margin of the middle phalanx/PIP joint.  IMPRESSION: Angulation of the distal aspect of the fourth and fifth metatarsal bones may reflect acute fractures or sequelae of prior trauma.  There is a linear lucency through the base of the fifth proximal phalanx and fragmentation along the medial margin of the middle phalanx/PIP joint of the 5th digit, which may represent acute fractures.  Correlate with point tenderness.  Original Report Authenticated By: Waneta Martins, M.D.     Assessment & Plan:

## 2011-09-26 NOTE — Assessment & Plan Note (Signed)
Ortho referral  

## 2011-09-26 NOTE — Assessment & Plan Note (Signed)
I will check her B12 level 

## 2011-09-26 NOTE — Assessment & Plan Note (Signed)
She reports no significant changes about this and she does not want a med for N/V

## 2011-09-26 NOTE — Assessment & Plan Note (Signed)
Her BP is well controlled 

## 2011-09-26 NOTE — Addendum Note (Signed)
Addended by: Jackson Latino on: 09/26/2011 04:58 PM   Modules accepted: Orders

## 2011-09-26 NOTE — Assessment & Plan Note (Signed)
I will check her A1C level today 

## 2011-09-26 NOTE — Assessment & Plan Note (Signed)
I will check her renal function and lytes today 

## 2011-09-27 ENCOUNTER — Telehealth: Payer: Self-pay

## 2011-09-27 ENCOUNTER — Other Ambulatory Visit: Payer: Self-pay | Admitting: *Deleted

## 2011-09-27 NOTE — Telephone Encounter (Signed)
ok 

## 2011-09-27 NOTE — Telephone Encounter (Signed)
PATIENT PHARMACY REQUESTING REFILL ON HALCION TAB 0.25MG . PATEINT DR. Kateri Mc OUT OF OFFICE TODAY. PLEASE ADVISE. LAST OV 09/26/2011 WITH YOU AND LAST OV WITH DR. Debby Bud 12/03/2010

## 2011-09-27 NOTE — Telephone Encounter (Signed)
Labs were stable, she does not appear to be dehydrated, there was some blood in the urine - let me know if she has any flank/abd pain or urinary symtoms

## 2011-09-27 NOTE — Telephone Encounter (Signed)
Pt's spouse called requesting pt's test results

## 2011-09-28 ENCOUNTER — Emergency Department (INDEPENDENT_AMBULATORY_CARE_PROVIDER_SITE_OTHER)
Admission: EM | Admit: 2011-09-28 | Discharge: 2011-09-28 | Disposition: A | Discharge status: Home / Self Care | Payer: Medicare Other | Source: Home / Self Care | Attending: Family Medicine | Admitting: Family Medicine

## 2011-09-28 ENCOUNTER — Encounter (HOSPITAL_COMMUNITY): Payer: Self-pay

## 2011-09-28 DIAGNOSIS — Z4802 Encounter for removal of sutures: Secondary | ICD-10-CM

## 2011-09-28 NOTE — ED Notes (Signed)
Pt here to have sutures removed from 5th toe that were put in on 09-21-11.

## 2011-09-28 NOTE — ED Provider Notes (Signed)
History     CSN: [id number]  Arrival date & time 09/28/11  1318   First MD Initiated Contact with Patient 09/28/11 1339      Chief Complaint  Patient presents with  . Suture / Staple Removal    (Consider location/radiation/quality/duration/timing/severity/associated sxs/prior treatment) Patient is a 61 y.o. female presenting with suture removal. The history is provided by the patient and a relative.  Suture / Staple Removal  The sutures were placed 7 to 10 days ago. There has been no treatment since the wound repair. There has been no drainage from the wound. There is no redness present. There is no swelling present. The pain has no pain. She has no difficulty moving the affected extremity or digit.    Past Medical History  Diagnosis Date  . Hypertension   . Diabetes mellitus   . Stroke   . Detached retina   . Blind right eye   . Fibroid     Fundal myoma  . Thyroid disease     Hypothyroid  . Kidney dysfunction   . Elevated cholesterol     Past Surgical History  Procedure Date  . Eye surgery   . Cesarean section   . Back surgery   . Appendectomy   . Cholecystectomy   . Carotid artery blockage   . Foot surgery     Family History  Problem Relation Age of Onset  . Hypertension Father   . Diabetes Sister   . Heart disease Sister   . Diabetes Brother   . Hypertension Brother   . Heart disease Brother   . Cancer Mother     Lung cancer    History  Substance Use Topics  . Smoking status: Never Smoker   . Smokeless tobacco: Never Used  . Alcohol Use: No    OB History    Grav Para Term Preterm Abortions TAB SAB Ect Mult Living   1 1 1       1       Review of Systems  Constitutional: Negative.   Skin: Positive for wound.    Allergies  Codeine and Morphine and related  Home Medications   Current Outpatient Rx  Name Route Sig Dispense Refill  . ASPIRIN 81 MG PO TABS Oral Take 81 mg by mouth daily.      . B-100 BALANCED TR PO Oral Take 1 tablet by  mouth daily.     Marland Kitchen CARVEDILOL 25 MG PO TABS  TAKE 1 TABLET TWICE A DAY  WITH MEALS 180 tablet 1  . CLOPIDOGREL BISULFATE 75 MG PO TABS Oral Take 1 tablet (75 mg total) by mouth daily. 90 tablet 2  . FLUDROCORTISONE ACETATE 0.1 MG PO TABS  TAKE 2 TABLETS (0.2 MG TOTAL) BY MOUTH 2 (TWO) TIMES DAILY. 120 tablet 1  . FOLIC ACID 1 MG PO TABS  TAKE 1 TABLET BY MOUTH EVERY DAY 90 tablet 3  . FUROSEMIDE 40 MG PO TABS  TAKE 1 TABLET ONCE DAILY 90 tablet 3  . INSULIN GLARGINE 100 UNIT/ML [place] SOLN Subcutaneous Inject 25 Units into the skin daily. 10 mL 1  . LEVOTHYROXINE SODIUM 75 MCG PO TABS Oral Take 1 tablet (75 mcg total) by mouth daily. 90 tablet 3  . POTASSIUM CHLORIDE CRYS ER 20 MEQ PO TBCR Oral Take 20 mEq by mouth 2 (two) times daily.     . SERTRALINE HCL 100 MG PO TABS Oral Take 1 tablet (100 mg total) by mouth daily. 90 tablet 2  .  SIMVASTATIN 20 MG PO TABS Oral Take 1 tablet (20 mg total) by mouth at bedtime. 90 tablet 2  . TRAMADOL HCL 50 MG PO TABS Oral Take 1 tablet (50 mg total) by mouth every 6 (six) hours as needed for pain. 10 tablet 0  . TRIAZOLAM 0.25 MG PO TABS Oral Take 1 tablet (0.25 mg total) by mouth at bedtime. 90 tablet 1  . ZEMPLAR 1 MCG PO CAPS Oral Take 1 tablet by mouth Daily.      BP 124/77  Pulse 60  Temp 97 F (36.1 C) (Oral)  Resp 18  SpO2 100%  Physical Exam  Nursing note and vitals reviewed. Constitutional: She appears well-developed and well-nourished.  Skin: Skin is warm and dry.       Right 5th toe lac well healed, 4 stitches removed, healed.    ED Course  Procedures (including critical care time)  Labs Reviewed - No data to display No results found.   1. Encounter for removal of sutures       MDM  Sutures removed.       Linna Hoff, MD 09/28/11 1351

## 2011-09-30 ENCOUNTER — Encounter (HOSPITAL_COMMUNITY): Payer: Self-pay | Admitting: Emergency Medicine

## 2011-09-30 ENCOUNTER — Emergency Department (HOSPITAL_COMMUNITY): Payer: Medicare Other

## 2011-09-30 ENCOUNTER — Telehealth: Payer: Self-pay | Admitting: Internal Medicine

## 2011-09-30 ENCOUNTER — Observation Stay (HOSPITAL_COMMUNITY): Payer: Medicare Other

## 2011-09-30 ENCOUNTER — Inpatient Hospital Stay (HOSPITAL_COMMUNITY)
Admission: EM | Admit: 2011-09-30 | Discharge: 2011-10-05 | DRG: 069 | Disposition: A | Discharge status: Skilled Nursing Facility | Payer: Medicare Other | Attending: Family Medicine | Admitting: Family Medicine

## 2011-09-30 DIAGNOSIS — R4701 Aphasia: Secondary | ICD-10-CM | POA: Diagnosis present

## 2011-09-30 DIAGNOSIS — I10 Essential (primary) hypertension: Secondary | ICD-10-CM | POA: Diagnosis present

## 2011-09-30 DIAGNOSIS — E039 Hypothyroidism, unspecified: Secondary | ICD-10-CM | POA: Diagnosis present

## 2011-09-30 DIAGNOSIS — I129 Hypertensive chronic kidney disease with stage 1 through stage 4 chronic kidney disease, or unspecified chronic kidney disease: Secondary | ICD-10-CM | POA: Diagnosis present

## 2011-09-30 DIAGNOSIS — Z79899 Other long term (current) drug therapy: Secondary | ICD-10-CM

## 2011-09-30 DIAGNOSIS — E7800 Pure hypercholesterolemia, unspecified: Secondary | ICD-10-CM | POA: Diagnosis present

## 2011-09-30 DIAGNOSIS — Z8679 Personal history of other diseases of the circulatory system: Secondary | ICD-10-CM

## 2011-09-30 DIAGNOSIS — E119 Type 2 diabetes mellitus without complications: Secondary | ICD-10-CM | POA: Diagnosis present

## 2011-09-30 DIAGNOSIS — F3289 Other specified depressive episodes: Secondary | ICD-10-CM | POA: Diagnosis present

## 2011-09-30 DIAGNOSIS — G459 Transient cerebral ischemic attack, unspecified: Principal | ICD-10-CM | POA: Diagnosis present

## 2011-09-30 DIAGNOSIS — F329 Major depressive disorder, single episode, unspecified: Secondary | ICD-10-CM

## 2011-09-30 DIAGNOSIS — E785 Hyperlipidemia, unspecified: Secondary | ICD-10-CM | POA: Diagnosis present

## 2011-09-30 DIAGNOSIS — I951 Orthostatic hypotension: Secondary | ICD-10-CM | POA: Diagnosis present

## 2011-09-30 DIAGNOSIS — E538 Deficiency of other specified B group vitamins: Secondary | ICD-10-CM | POA: Diagnosis present

## 2011-09-30 DIAGNOSIS — I359 Nonrheumatic aortic valve disorder, unspecified: Secondary | ICD-10-CM

## 2011-09-30 DIAGNOSIS — Z8673 Personal history of transient ischemic attack (TIA), and cerebral infarction without residual deficits: Secondary | ICD-10-CM

## 2011-09-30 DIAGNOSIS — H5440 Blindness, one eye, unspecified eye: Secondary | ICD-10-CM | POA: Diagnosis present

## 2011-09-30 DIAGNOSIS — N184 Chronic kidney disease, stage 4 (severe): Secondary | ICD-10-CM | POA: Diagnosis present

## 2011-09-30 DIAGNOSIS — Z7982 Long term (current) use of aspirin: Secondary | ICD-10-CM

## 2011-09-30 DIAGNOSIS — I2510 Atherosclerotic heart disease of native coronary artery without angina pectoris: Secondary | ICD-10-CM | POA: Diagnosis present

## 2011-09-30 DIAGNOSIS — Z7902 Long term (current) use of antithrombotics/antiplatelets: Secondary | ICD-10-CM

## 2011-09-30 DIAGNOSIS — R319 Hematuria, unspecified: Secondary | ICD-10-CM | POA: Diagnosis present

## 2011-09-30 DIAGNOSIS — I5189 Other ill-defined heart diseases: Secondary | ICD-10-CM | POA: Insufficient documentation

## 2011-09-30 DIAGNOSIS — I674 Hypertensive encephalopathy: Secondary | ICD-10-CM | POA: Diagnosis present

## 2011-09-30 LAB — CBC WITH DIFFERENTIAL/PLATELET
Basophils Absolute: 0 10*3/uL (ref 0.0–0.1)
Basophils Relative: 0 % (ref 0–1)
Eosinophils Relative: 2 % (ref 0–5)
HCT: 31.3 % — ABNORMAL LOW (ref 36.0–46.0)
Hemoglobin: 10.8 g/dL — ABNORMAL LOW (ref 12.0–15.0)
MCH: 32.6 pg (ref 26.0–34.0)
MCHC: 34.5 g/dL (ref 30.0–36.0)
MCV: 94.6 fL (ref 78.0–100.0)
Monocytes Absolute: 0.6 10*3/uL (ref 0.1–1.0)
Monocytes Relative: 9 % (ref 3–12)
Neutro Abs: 5.1 10*3/uL (ref 1.7–7.7)
RDW: 12.7 % (ref 11.5–15.5)

## 2011-09-30 LAB — LIPID PANEL
Cholesterol: 150 mg/dL (ref 0–200)
HDL: 48 mg/dL (ref >39)
Total CHOL/HDL Ratio: 3.1 RATIO
VLDL: 23 mg/dL (ref 0–40)

## 2011-09-30 LAB — URINALYSIS, ROUTINE W REFLEX MICROSCOPIC
Bilirubin Urine: NEGATIVE
Glucose, UA: 250 mg/dL — AB
Ketones, ur: NEGATIVE mg/dL
Protein, ur: 30 mg/dL — AB
pH: 5.5 (ref 5.0–8.0)

## 2011-09-30 LAB — BASIC METABOLIC PANEL
BUN: 23 mg/dL (ref 6–23)
Calcium: 10.8 mg/dL — ABNORMAL HIGH (ref 8.4–10.5)
Chloride: 105 mEq/L (ref 96–112)
Creatinine, Ser: 3.35 mg/dL — ABNORMAL HIGH (ref 0.50–1.10)
GFR calc Af Amer: 16 mL/min — ABNORMAL LOW (ref >90)

## 2011-09-30 LAB — URINE MICROSCOPIC-ADD ON

## 2011-09-30 LAB — APTT: aPTT: 27 seconds (ref 24–37)

## 2011-09-30 LAB — PROTIME-INR
INR: 1.16 (ref 0.00–1.49)
Prothrombin Time: 15 seconds (ref 11.6–15.2)

## 2011-09-30 MED ORDER — CARVEDILOL 25 MG PO TABS
25.0000 mg | ORAL_TABLET | Freq: Two times a day (BID) | ORAL | Status: DC
  Filled 2011-09-30: qty 1

## 2011-09-30 MED ORDER — INSULIN ASPART 100 UNIT/ML ~~LOC~~ SOLN
0.0000 [IU] | Freq: Three times a day (TID) | SUBCUTANEOUS | Status: DC
  Administered 2011-10-01 (×2): 1 [IU] via SUBCUTANEOUS
  Administered 2011-10-02: 2 [IU] via SUBCUTANEOUS

## 2011-09-30 MED ORDER — THIAMINE HCL 100 MG/ML IJ SOLN: 100.0000 mg | Freq: Every day | INTRAMUSCULAR | Status: DC

## 2011-09-30 MED ORDER — HYDRALAZINE HCL 25 MG PO TABS
25.0000 mg | ORAL_TABLET | Freq: Four times a day (QID) | ORAL | Status: DC | PRN
  Administered 2011-09-30 – 2011-10-02 (×2): 25 mg via ORAL
  Filled 2011-09-30 (×5): qty 1

## 2011-09-30 MED ORDER — ASPIRIN 81 MG PO TABS: 81.0000 mg | ORAL_TABLET | Freq: Every day | ORAL | Status: DC

## 2011-09-30 MED ORDER — CLOPIDOGREL BISULFATE 75 MG PO TABS
75.0000 mg | ORAL_TABLET | Freq: Every day | ORAL | Status: DC
  Administered 2011-10-01 – 2011-10-05 (×5): 75 mg via ORAL
  Filled 2011-09-30 (×5): qty 1

## 2011-09-30 MED ORDER — LEVOTHYROXINE SODIUM 75 MCG PO TABS
75.0000 ug | ORAL_TABLET | Freq: Every day | ORAL | Status: DC
  Administered 2011-10-01 – 2011-10-05 (×5): 75 ug via ORAL
  Filled 2011-09-30 (×6): qty 1

## 2011-09-30 MED ORDER — HYDRALAZINE HCL 25 MG PO TABS
25.0000 mg | ORAL_TABLET | Freq: Four times a day (QID) | ORAL | Status: DC | PRN
  Administered 2011-10-04 – 2011-10-05 (×3): 25 mg via ORAL
  Filled 2011-09-30: qty 1

## 2011-09-30 MED ORDER — ASPIRIN 325 MG PO TABS
325.0000 mg | ORAL_TABLET | Freq: Every day | ORAL | Status: DC
  Administered 2011-09-30: 325 mg via ORAL
  Filled 2011-09-30: qty 1

## 2011-09-30 MED ORDER — SODIUM CHLORIDE 0.9 % IV SOLN: 1000.0000 mL | INTRAVENOUS | Status: DC

## 2011-09-30 MED ORDER — ASPIRIN 81 MG PO CHEW
81.0000 mg | CHEWABLE_TABLET | Freq: Every day | ORAL | Status: DC
  Administered 2011-10-01 – 2011-10-05 (×5): 81 mg via ORAL
  Filled 2011-09-30 (×5): qty 1

## 2011-09-30 MED ORDER — SERTRALINE HCL 100 MG PO TABS
100.0000 mg | ORAL_TABLET | Freq: Every day | ORAL | Status: DC
  Administered 2011-10-01 – 2011-10-05 (×5): 100 mg via ORAL
  Filled 2011-09-30 (×5): qty 1

## 2011-09-30 MED ORDER — CARVEDILOL 25 MG PO TABS
25.0000 mg | ORAL_TABLET | Freq: Two times a day (BID) | ORAL | Status: DC
  Administered 2011-09-30 – 2011-10-05 (×10): 25 mg via ORAL
  Filled 2011-09-30 (×12): qty 1

## 2011-09-30 MED ORDER — THIAMINE HCL 100 MG/ML IJ SOLN
100.0000 mg | Freq: Every day | INTRAMUSCULAR | Status: DC
  Filled 2011-09-30: qty 1

## 2011-09-30 MED ORDER — SIMVASTATIN 20 MG PO TABS
20.0000 mg | ORAL_TABLET | Freq: Every day | ORAL | Status: DC
  Administered 2011-09-30 – 2011-10-04 (×6): 20 mg via ORAL
  Filled 2011-09-30 (×9): qty 1

## 2011-09-30 NOTE — Telephone Encounter (Signed)
Caller: Hoyle/Spouse; Patient Name: [name]; PCP: Illene Regulus; Best Callback Phone Number: [phone]  Today,  09/30/2011,   Husband calling concerned because pt woke several times last night   trying to get dressed and leave home. She is   acting confused " talking out of her head ". Pt has been diagnosed with dementia and this past week  has been worsening. Pt has had  history of TIA , and gets  confused when she has them.  Pt has Diabetes, but Husband  stating he is unable to check BS and pt doesn't  know how to do it. Husband doesn't know what to do because he is supposed to be at chemo appt at 0900. RN advised do not leave pt alone and hang up and 911 for  change in mental status with worsening confusion per Confusion, Disorientation, Agitation protocol and he agreed and stated he will try calling Daughter as well.

## 2011-09-30 NOTE — ED Notes (Signed)
Stroke swallow screen given and passed without any problems 

## 2011-09-30 NOTE — ED Provider Notes (Signed)
History     CSN: [id number]  Arrival date & time 09/30/11  1057   First MD Initiated Contact with Patient 09/30/11 1058      Chief Complaint  Patient presents with  . Altered Mental Status    (Consider location/radiation/quality/duration/timing/severity/associated sxs/prior treatment) HPI Comments: Patient is a 61 year old female with a history of hypertension, diabetes, stroke, and renal insufficiency that presents emergency department with the chief complaint of altered mental status.  History is obtained from patient's daughter.  Symptom onset began Saturday afternoon when the patient experienced expressive a seizure a for approximately 15 minutes.  The symptoms completely resolved and then earlier this morning the patient developed aphasia again.  These symptoms also resolved however she has had some retained confusion and altered mental status described as "saying things that don't make sense and are not true".  Patient is a level V caveat but denies any medical symptoms.  Patient is a 61 y.o. female presenting with altered mental status. The history is provided by the patient.  Altered Mental Status    Past Medical History  Diagnosis Date  . Hypertension   . Diabetes mellitus   . Stroke   . Detached retina   . Blind right eye   . Fibroid     Fundal myoma  . Thyroid disease     Hypothyroid  . Kidney dysfunction   . Elevated cholesterol     Past Surgical History  Procedure Date  . Eye surgery   . Cesarean section   . Back surgery   . Appendectomy   . Cholecystectomy   . Carotid artery blockage   . Foot surgery     Family History  Problem Relation Age of Onset  . Hypertension Father   . Diabetes Sister   . Heart disease Sister   . Diabetes Brother   . Hypertension Brother   . Heart disease Brother   . Cancer Mother     Lung cancer    History  Substance Use Topics  . Smoking status: Never Smoker   . Smokeless tobacco: Never Used  . Alcohol Use: No     OB History    Grav Para Term Preterm Abortions TAB SAB Ect Mult Living   1 1 1       1       Review of Systems  Unable to perform ROS: Mental status change  Psychiatric/Behavioral: Positive for altered mental status.  All other systems reviewed and are negative.    Allergies  Codeine and Morphine and related  Home Medications   Current Outpatient Rx  Name Route Sig Dispense Refill  . ASPIRIN 81 MG PO TABS Oral Take 81 mg by mouth daily.      . B-100 BALANCED TR PO Oral Take 1 tablet by mouth daily.     Marland Kitchen CARVEDILOL 25 MG PO TABS  TAKE 1 TABLET TWICE A DAY  WITH MEALS 180 tablet 1  . CLOPIDOGREL BISULFATE 75 MG PO TABS Oral Take 1 tablet (75 mg total) by mouth daily. 90 tablet 2  . FLUDROCORTISONE ACETATE 0.1 MG PO TABS  TAKE 2 TABLETS (0.2 MG TOTAL) BY MOUTH 2 (TWO) TIMES DAILY. 120 tablet 1  . FOLIC ACID 1 MG PO TABS  TAKE 1 TABLET BY MOUTH EVERY DAY 90 tablet 3  . FUROSEMIDE 40 MG PO TABS  TAKE 1 TABLET ONCE DAILY 90 tablet 3  . INSULIN GLARGINE 100 UNIT/ML [place] SOLN Subcutaneous Inject 25 Units into the skin daily. 10  mL 1  . LEVOTHYROXINE SODIUM 75 MCG PO TABS Oral Take 1 tablet (75 mcg total) by mouth daily. 90 tablet 3  . POTASSIUM CHLORIDE CRYS ER 20 MEQ PO TBCR Oral Take 40 mEq by mouth 2 (two) times daily.     . SERTRALINE HCL 100 MG PO TABS Oral Take 1 tablet (100 mg total) by mouth daily. 90 tablet 2  . SIMVASTATIN 20 MG PO TABS Oral Take 1 tablet (20 mg total) by mouth at bedtime. 90 tablet 2  . TRAMADOL HCL 50 MG PO TABS Oral Take 1 tablet (50 mg total) by mouth every 6 (six) hours as needed for pain. 10 tablet 0  . TRIAZOLAM 0.25 MG PO TABS Oral Take 1 tablet (0.25 mg total) by mouth at bedtime. 90 tablet 1  . ZEMPLAR 1 MCG PO CAPS Oral Take 1 tablet by mouth Daily.      BP 178/69  Pulse 61  Temp 98.7 F (37.1 C) (Oral)  Resp 14  SpO2 100%  Physical Exam  Nursing note and vitals reviewed. Constitutional: She appears well-developed and well-nourished. No  distress.  HENT:  Head: Normocephalic and atraumatic.  Eyes: Conjunctivae and EOM are normal. Pupils are equal, round, and reactive to light. No scleral icterus.  Neck: Normal range of motion. Neck supple.  Cardiovascular: Normal rate, regular rhythm, normal heart sounds and intact distal pulses.   Pulmonary/Chest: Effort normal. No respiratory distress. She has no wheezes. She has no rales. She exhibits no tenderness.  Abdominal: Soft. There is no tenderness.  Musculoskeletal: She exhibits no edema and no tenderness.  Neurological: She is alert. She displays a negative Romberg sign.       No facial paralysis or slurring of speech, patient is alert and oriented to self. CN III-XII intact. Good coordination.    Skin: Skin is warm and dry. No petechiae, no purpura and no rash noted. She is not diaphoretic.  Psychiatric: Her speech is not slurred. Cognition and memory are impaired.    ED Course  Procedures (including critical care time)  Labs Reviewed  URINALYSIS, ROUTINE W REFLEX MICROSCOPIC - Abnormal; Notable for the following:    Glucose, UA 250 (*)     Hgb urine dipstick MODERATE (*)     Protein, ur 30 (*)     All other components within normal limits  CBC WITH DIFFERENTIAL - Abnormal; Notable for the following:    RBC 3.31 (*)     Hemoglobin 10.8 (*)     HCT 31.3 (*)     Platelets 140 (*)     All other components within normal limits  BASIC METABOLIC PANEL - Abnormal; Notable for the following:    Glucose, Bld 151 (*)     Creatinine, Ser 3.35 (*)     Calcium 10.8 (*)     GFR calc non Af Amer 14 (*)     GFR calc Af Amer 16 (*)     All other components within normal limits  URINE MICROSCOPIC-ADD ON - Abnormal; Notable for the following:    Squamous Epithelial / LPF FEW (*)     Casts GRANULAR CAST (*)  HYALINE CASTS   All other components within normal limits  DRUGS OF ABUSE SCREEN W ALC, ROUTINE URINE   Ct Head Wo Contrast  09/30/2011  *RADIOLOGY REPORT*  Clinical Data:  Altered mental status.  CT HEAD WITHOUT CONTRAST  Technique:  Contiguous axial images were obtained from the base of the skull through the vertex  without contrast.  Comparison: 09/29/2009  Findings: There is no evidence of intracranial hemorrhage, brain edema or other signs of acute infarction.  There is no evidence of intracranial mass lesion or mass effect.  No abnormal extra-axial fluid collections are identified.  Ventricles stable in size. Extensive chronic small vessel disease is again demonstrated as well as old left to prior occipital infarct and ex vacuo dilatation of the left occipital horn and atrium of the left lateral ventricle. Old left base of the ganglia lacune appears stable.  No skull abnormality identified.  IMPRESSION:  1.  No acute intracranial abnormality. 2. 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   Original Report Authenticated By: Danae Orleans, M.D. ( 09/30/2011 12:40:15 )     Date: 09/30/2011  Rate: 60  Rhythm: normal sinus rhythm  QRS Axis: normal  Intervals: normal  ST/T Wave abnormalities: normal  Conduction Disutrbances: none  Narrative Interpretation:   Old EKG Reviewed: No significant changes noted     No diagnosis found.    MDM  Alt mental status  Patient history and plan has been discussed with the CDU midlevel provider who is agreeable with transfer. This patient has also been seen and evaluated by the attending who agrees that the patient will benefit from observation. Patient will be placed on TIA protocol with re-evaluation pending. Patient stable prior to transfer, has no complaints, and is in NAD.   4:15 PM Addendum: Pt to be admitted as still having s/s of confusion & dec memory. Does not meet criteria for TIA protocol. ADmit [facility] Team 10 Dr. Margaretha Sheffield, [place] 09/30/11 [phone]

## 2011-09-30 NOTE — Progress Notes (Signed)
Initial review for observation status is complete. 

## 2011-09-30 NOTE — ED Notes (Signed)
Per scott rn pt has gone to mri and will then go to vascular lab before coming to [facility]

## 2011-09-30 NOTE — ED Notes (Signed)
Pt ambulatory to bathroom using walker with min. Assistance.  Pt tolerated well.  Jody with SW in to talk to family

## 2011-09-30 NOTE — ED Provider Notes (Signed)
Medical screening examination/treatment/procedure(s) were conducted as a shared visit with non-physician practitioner(s) and myself.  I personally evaluated the patient during the encounter  Patient was seen by me. Patient placed in CDU under TIA protocol. Patient on Saturday had an expressive aphasia and had a recurrent episode of that again today. All symptoms have resolved now and symptoms were Saturday had resolved. Disposition will be based on the TIA protocol.     Shelda Jakes, MD 09/30/11 [phone]

## 2011-09-30 NOTE — ED Notes (Addendum)
Per EMS: pt from home c/o AMS x 3 days; pt alert and oriented but having trouble answering questions like her SSN and phone number; pt with increased confusion; no neuro deficits noted; pt lives with family and pt with frequency and urgency of urination

## 2011-09-30 NOTE — ED Notes (Signed)
Work Department Clinical Social Wo BRIEF PSYCHOSOCIAL ASSESSMENT 09/30/2011  Patient:  [name], [name]     Account Number:  [id number]     Admit date:  09/30/2011  Clinical Social Worker:  Illene Silver  Date/Time:  09/30/2011 05:19 PM  Referred by:  CSW  Date Referred:   Referred for  SNF Placement   Other Referral:   Interview type:  Family Other interview type:    PSYCHOSOCIAL DATA Living Status:  HUSBAND Admitted from facility:   Level of care:   Primary support name:  hoyle Welsch Primary support relationship to patient:  SPOUSE Degree of support available:   Spoke with pt's husband, daughter and SIL at bedside.  Pt lives at home with her husband and he is having an increasingly difficult time managing pt at home.  Husband has CA and is undergoing chemotherapy.  Husband reports he cannot pick pt up if/when she falls.  Daughter is a half-hour away.    CURRENT CONCERNS Current Concerns  Post-Acute Placement   Other Concerns:    SOCIAL WORK ASSESSMENT / PLAN CSW spoke with pt/family in ED.  Family would like pt to go to a rehab facility at d/c.  CSW explained Medicare criteria for SNF placement and could not guarantee pt would qualify for a three midnight stay.  Family would be agreeable to [facility] if placement could not be arranged. Family encouraged to look into applying for Medicaid, as they insist that pt cannot private pay for her care. They report that she is on disability and that it is due to run out soon.   Assessment/plan status:  Psychosocial Support/Ongoing Assessment of Needs Other assessment/ plan:   Information/referral to community resources:   Family referred to [employer]. DSS.    PATIENT'S/FAMILY'S RESPONSE TO PLAN OF CARE: Concerned re: pt not meeting criteria for placement and d/c home with minimal care.  Emotional support offered.

## 2011-09-30 NOTE — Progress Notes (Signed)
VASCULAR LAB PRELIMINARY  PRELIMINARY  PRELIMINARY  PRELIMINARY  Carotid Dopplers completed.    Preliminary report:  There is no ICA stenosis.  Vertebral artery flow is antegrade.  Sonny Anthes, 09/30/2011, 2:29 PM

## 2011-09-30 NOTE — Progress Notes (Signed)
Disposition Note  [name], is a 61 y.o. female,   MRN: [id number]  -  DOB - 13-Nov-1950  Outpatient Primary MD for the patient is Illene Regulus, MD   Blood pressure 167/69, pulse 61, temperature 98.7 F (37.1 C), temperature source Oral, resp. rate 20, SpO2 100.00%.  Principal Problem:  *TIA (transient ischemic attack) Active Problems:  HYPERTENSION  Aphasia  DIABETES MELLITUS, TYPE II  CORONARY ARTERY DISEASE  CKD (chronic kidney disease), stage IV  CEREBROVASCULAR ACCIDENT, HX OF  B12 DEFICIENCY  HYPERLIPIDEMIA   Older female patient of Dr. Casimiro Needle Norin's. She has a past medical history of hypertension, diabetes as well as CVA. She was brought to the ER today because she was not acting like herself according to the family. Apparently her speech pattern was not making any sense this morning. This past Saturday evening the patient had a 15 minute episode of expressive aphasia that apparently resolved up until today's symptoms. In the emergency department she has undergone a CT of the head which was negative. Initial plans were to keep the patient in the CDU for a TIA valuation and diagnostic workup had been initiated including carotid Dopplers. An MRI of the head has been obtained but the results are pending. Unfortunately with her prior stroke history a diagnosis of TIA at this point warrants admission to the hospital. Initial laboratory data are unrevealing. Creatinine is at baseline. No leukocytosis. Urinalysis is abnormal and could be consistent with a urinary tract infection. I have spoken with the emergency department and requested telemetry bed placement as well as initiation of routine holding orders. I have also notified manager's office of this bed request. Please note that I have not interviewed or examined this patient.  Junious Silk, ANP

## 2011-09-30 NOTE — H&P (Addendum)
PCP:   Illene Regulus, MD   Chief Complaint:  Confusion  HPI: 61 y/o female was brought to the hospital with worsening confusion which started this morning around 4 am, when she was getting ready to go for work. Patient did not have slurred speech, no passing out, no seizure. Her husband told me that he stopped her lasix tow days ago, and patient is found to have elevated BP in the ER. Patient had CT Head, MRI Brain which did not show stroke. She denies blurred vision, no chest pain , no shortness of breath.  Patient has h/o diabetes mellitus and she has not been eating well over the past few days, her blood glucose was found to be in 140's this mor ning by her daughter.  Allergies:   Allergies  Allergen Reactions  . Codeine Nausea Only  . Morphine And Related     "makes her crazy in the head"      Past Medical History  Diagnosis Date  . Hypertension   . Diabetes mellitus   . Stroke   . Detached retina   . Blind right eye   . Fibroid     Fundal myoma  . Thyroid disease     Hypothyroid  . Kidney dysfunction   . Elevated cholesterol     Past Surgical History  Procedure Date  . Eye surgery   . Cesarean section   . Back surgery   . Appendectomy   . Cholecystectomy   . Carotid artery blockage   . Foot surgery     Prior to Admission medications   Medication Sig Start Date End Date Taking? Authorizing Provider  aspirin 81 MG tablet Take 81 mg by mouth daily.     Yes Historical Provider, MD  B Complex-Folic Acid (B-100 BALANCED TR PO) Take 1 tablet by mouth daily.    Yes Historical Provider, MD  carvedilol (COREG) 25 MG tablet TAKE 1 TABLET TWICE A DAY  WITH MEALS 07/01/11  Yes Jacques Navy, MD  clopidogrel (PLAVIX) 75 MG tablet Take 1 tablet (75 mg total) by mouth daily. 03/20/11  Yes Jacques Navy, MD  fludrocortisone (FLORINEF) 0.1 MG tablet TAKE 2 TABLETS (0.2 MG TOTAL) BY MOUTH 2 (TWO) TIMES DAILY. 08/16/11  Yes Jacques Navy, MD  folic acid (FOLVITE) 1 MG tablet  TAKE 1 TABLET BY MOUTH EVERY DAY 08/19/11  Yes Jacques Navy, MD  furosemide (LASIX) 40 MG tablet TAKE 1 TABLET ONCE DAILY 09/15/11  Yes Jacques Navy, MD  insulin glargine (LANTUS) 100 UNIT/ML injection Inject 25 Units into the skin daily. 08/19/11 08/18/12 Yes Jacques Navy, MD  levothyroxine (SYNTHROID, LEVOTHROID) 75 MCG tablet Take 1 tablet (75 mcg total) by mouth daily. 04/09/11 04/08/12 Yes Jacques Navy, MD  potassium chloride SA (K-DUR,KLOR-CON) 20 MEQ tablet Take 40 mEq by mouth 2 (two) times daily.    Yes Historical Provider, MD  sertraline (ZOLOFT) 100 MG tablet Take 1 tablet (100 mg total) by mouth daily. 03/20/11  Yes Jacques Navy, MD  simvastatin (ZOCOR) 20 MG tablet Take 1 tablet (20 mg total) by mouth at bedtime. 03/20/11  Yes Jacques Navy, MD  traMADol (ULTRAM) 50 MG tablet Take 1 tablet (50 mg total) by mouth every 6 (six) hours as needed for pain. 09/21/11 10/01/11 Yes Raeford Razor, MD  triazolam (HALCION) 0.25 MG tablet Take 1 tablet (0.25 mg total) by mouth at bedtime. 03/21/11  Yes Jacques Navy, MD  ZEMPLAR 1 MCG  capsule Take 1 tablet by mouth Daily. 12/16/10  Yes Historical Provider, MD    Social History:  reports that she has never smoked. She has never used smokeless tobacco. She reports that she does not drink alcohol or use illicit drugs.  Family History  Problem Relation Age of Onset  . Hypertension Father   . Diabetes Sister   . Heart disease Sister   . Diabetes Brother   . Hypertension Brother   . Heart disease Brother   . Cancer Mother     Lung cancer    Review of Systems:  HEENT: Denies headache, blurred vision, runny nose, sore throat,  Neck: Denies thyroid problems,lymphadenopathy Chest : Denies shortness of breath, no history of COPD Heart : Denies Chest pain,  GI: H/O vomiting three days ago GU: UA showed hematuria on 8/15 Neuro: h/O stroke 10 yrs ago    Physical Exam: Blood pressure 203/80, pulse 61, temperature 98.4 F (36.9 C),  temperature source Oral, resp. rate 20, SpO2 94.00%. Constitutional:   Patient is a well-developed and well-nourished female in no acute distress and cooperative with exam. Head: Normocephalic and atraumatic Mouth: Mucus membranes moist Eyes: PERRL, EOMI, conjunctivae normal Neck: Supple, No Thyromegaly Cardiovascular: RRR, S1 normal, S2 normal Pulmonary/Chest: CTAB, no wheezes, rales, or rhonchi Abdominal: Soft. Non-tender, non-distended, bowel sounds are normal, no masses, organomegaly, or guarding present.  Neurological: A&O x2, Strenght is normal and symmetric bilaterally, cranial nerve II-XII are grossly intact, no focal motor deficit, sensory intact to light touch bilaterally.  Extremities : No Cyanosis, Clubbing or Edema   Labs on Admission:  Results for orders placed during the hospital encounter of 09/30/11 (from the past 48 hour(s))  CBC WITH DIFFERENTIAL     Status: Abnormal   Collection Time   09/30/11 11:38 AM      Component Value Range Comment   WBC 7.3  4.0 - 10.5 K/uL    RBC 3.31 (*) 3.87 - 5.11 MIL/uL    Hemoglobin 10.8 (*) 12.0 - 15.0 g/dL    HCT 16.1 (*) 09.6 - 46.0 %    MCV 94.6  78.0 - 100.0 fL    MCH 32.6  26.0 - 34.0 pg    MCHC 34.5  30.0 - 36.0 g/dL    RDW 04.5  40.9 - 81.1 %    Platelets 140 (*) 150 - 400 K/uL    Neutrophils Relative 70  43 - 77 %    Neutro Abs 5.1  1.7 - 7.7 K/uL    Lymphocytes Relative 19  12 - 46 %    Lymphs Abs 1.4  0.7 - 4.0 K/uL    Monocytes Relative 9  3 - 12 %    Monocytes Absolute 0.6  0.1 - 1.0 K/uL    Eosinophils Relative 2  0 - 5 %    Eosinophils Absolute 0.2  0.0 - 0.7 K/uL    Basophils Relative 0  0 - 1 %    Basophils Absolute 0.0  0.0 - 0.1 K/uL   BASIC METABOLIC PANEL     Status: Abnormal   Collection Time   09/30/11 11:38 AM      Component Value Range Comment   Sodium 140  135 - 145 mEq/L    Potassium 3.8  3.5 - 5.1 mEq/L    Chloride 105  96 - 112 mEq/L    CO2 25  19 - 32 mEq/L    Glucose, Bld 151 (*) 70 - 99 mg/dL  BUN 23  6 - 23 mg/dL    Creatinine, Ser 1.61 (*) 0.50 - 1.10 mg/dL    Calcium 09.6 (*) 8.4 - 10.5 mg/dL    GFR calc non Af Amer 14 (*) >90 mL/min    GFR calc Af Amer 16 (*) >90 mL/min   URINALYSIS, ROUTINE W REFLEX MICROSCOPIC     Status: Abnormal   Collection Time   09/30/11 12:10 PM      Component Value Range Comment   Color, Urine YELLOW  YELLOW    APPearance CLEAR  CLEAR    Specific Gravity, Urine 1.013  1.005 - 1.030    pH 5.5  5.0 - 8.0    Glucose, UA 250 (*) NEGATIVE mg/dL    Hgb urine dipstick MODERATE (*) NEGATIVE    Bilirubin Urine NEGATIVE  NEGATIVE    Ketones, ur NEGATIVE  NEGATIVE mg/dL    Protein, ur 30 (*) NEGATIVE mg/dL    Urobilinogen, UA 0.2  0.0 - 1.0 mg/dL    Nitrite NEGATIVE  NEGATIVE    Leukocytes, UA NEGATIVE  NEGATIVE   URINE MICROSCOPIC-ADD ON     Status: Abnormal   Collection Time   09/30/11 12:10 PM      Component Value Range Comment   Squamous Epithelial / LPF FEW (*) RARE    WBC, UA 0-2  <3 WBC/hpf    RBC / HPF 7-10  <3 RBC/hpf    Bacteria, UA RARE  RARE    Casts GRANULAR CAST (*) NEGATIVE HYALINE CASTS  PROTIME-INR     Status: Normal   Collection Time   09/30/11  4:34 PM      Component Value Range Comment   Prothrombin Time 15.0  11.6 - 15.2 seconds    INR 1.16  0.00 - 1.49   APTT     Status: Normal   Collection Time   09/30/11  4:34 PM      Component Value Range Comment   aPTT 27  24 - 37 seconds   LIPID PANEL     Status: Normal   Collection Time   09/30/11  4:35 PM      Component Value Range Comment   Cholesterol 150  0 - 200 mg/dL    Triglycerides 045  <409 mg/dL    HDL 48  >81 mg/dL    Total CHOL/HDL Ratio 3.1      VLDL 23  0 - 40 mg/dL    LDL Cholesterol 79  0 - 99 mg/dL     Radiological Exams on Admission: Dg Chest 2 View  09/30/2011  *RADIOLOGY REPORT*  Clinical Data: Shortness of breath.  Confusion.  Altered mental status.  CHEST - 2 VIEW  Comparison:  09/29/2009  Findings:  The heart size and mediastinal contours are within  normal limits.  Both lungs are clear.  The visualized skeletal structures are unremarkable.  IMPRESSION: No active cardiopulmonary disease.   Original Report Authenticated By: Danae Orleans, M.D.    Ct Head Wo Contrast  09/30/2011  *RADIOLOGY REPORT*  Clinical Data: Altered mental status.  CT HEAD WITHOUT CONTRAST  Technique:  Contiguous axial images were obtained from the base of the skull through the vertex without contrast.  Comparison: 09/29/2009  Findings: There is no evidence of intracranial hemorrhage, brain edema or other signs of acute infarction.  There is no evidence of intracranial mass lesion or mass effect.  No abnormal extra-axial fluid collections are identified.  Ventricles stable in size. Extensive chronic small vessel disease  is again demonstrated as well as old left to prior occipital infarct and ex vacuo dilatation of the left occipital horn and atrium of the left lateral ventricle. Old left base of the ganglia lacune appears stable.  No skull abnormality identified.  IMPRESSION:  1.  No acute intracranial abnormality. 2. 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   Original Report Authenticated By: Danae Orleans, M.D. ( 09/30/2011 12:40:15 )    Mr Brain Wo Contrast  09/30/2011  *RADIOLOGY REPORT*  Clinical Data:  Altered mental status for 3 days.  MRI HEAD WITHOUT CONTRAST MRA HEAD WITHOUT CONTRAST  Technique:  Multiplanar, multiecho pulse sequences of the brain and surrounding structures were obtained without intravenous contrast. Angiographic images of the head were obtained using MRA technique without contrast.  Comparison:  CT head earlier in the day.Prior MR head 01/16/2009.  MRI HEAD  Findings:  There is no evidence for acute infarction, intracranial hemorrhage, mass lesion, hydrocephalus, or extra-axial fluid. Moderate to severe atrophy is present.  Advanced chronic microvascular ischemic change is present in the  periventricular and subcortical white matter.  Remote large vessel infarct affects the left PCA territory subserving the posterior temporal lobe and occipital lobe on the left.  The second area of significant remote infarction affects the left basal ganglia including the caudate, putamen, and basal ganglia.  Prominent perivascular spaces are associated with chronic hypertension. Small slit-like remote infarct affects the posterior putamen/external capsule/post limited capsule on the right.  No foci of chronic hemorrhage.  Normal pituitary and cerebellar tonsils.  Upper cervical region unremarkable.   Chronic deformity right globe.  No acute sinus or mastoid disease. Compared with prior CT and MR, there is little interval change.  IMPRESSION: Chronic changes as described.  Atrophy with small vessel disease and remote infarcts.  No acute intracranial findings.  MRA HEAD  Findings: The internal carotid arteries are tortuous but widely patent.  The basilar artery is tortuous but widely patent.  Both vertebrals contribute to formation of the basilar with the left dominant.  Diffusely narrowed and irregular proximal right A1 anterior cerebral artery.  Both anterior cerebrals distally, as well as the proximal left A1 ACA are widely patent.  No proximal M1 MCA disease.  Focal narrowing right MCA M2 segment estimated be 50- 75%. Diffuse irregularity distal MCA branches.  Severe diffuse disease left PCA. Poor visualization of the anterior inferior cerebellar arteries, otherwise no cerebellar branch occlusion.  IMPRESSION: Intracranial atherosclerotic disease as described. The most severe proximal abnormality involves the right A1 ACA and diffusely in the left PCA.   Original Report Authenticated By: Elsie Stain, M.D.    Mr Mra Head/brain Wo Cm  09/30/2011  *RADIOLOGY REPORT*  Clinical Data:  Altered mental status for 3 days.  MRI HEAD WITHOUT CONTRAST MRA HEAD WITHOUT CONTRAST  Technique:  Multiplanar, multiecho pulse  sequences of the brain and surrounding structures were obtained without intravenous contrast. Angiographic images of the head were obtained using MRA technique without contrast.  Comparison:  CT head earlier in the day.Prior MR head 01/16/2009.  MRI HEAD  Findings:  There is no evidence for acute infarction, intracranial hemorrhage, mass lesion, hydrocephalus, or extra-axial fluid. Moderate to severe atrophy is present.  Advanced chronic microvascular ischemic change is present in the periventricular and subcortical white matter.  Remote large vessel infarct affects the left PCA territory subserving the posterior temporal lobe and occipital lobe on the left.  The  second area of significant remote infarction affects the left basal ganglia including the caudate, putamen, and basal ganglia.  Prominent perivascular spaces are associated with chronic hypertension. Small slit-like remote infarct affects the posterior putamen/external capsule/post limited capsule on the right.  No foci of chronic hemorrhage.  Normal pituitary and cerebellar tonsils.  Upper cervical region unremarkable.   Chronic deformity right globe.  No acute sinus or mastoid disease. Compared with prior CT and MR, there is little interval change.  IMPRESSION: Chronic changes as described.  Atrophy with small vessel disease and remote infarcts.  No acute intracranial findings.  MRA HEAD  Findings: The internal carotid arteries are tortuous but widely patent.  The basilar artery is tortuous but widely patent.  Both vertebrals contribute to formation of the basilar with the left dominant.  Diffusely narrowed and irregular proximal right A1 anterior cerebral artery.  Both anterior cerebrals distally, as well as the proximal left A1 ACA are widely patent.  No proximal M1 MCA disease.  Focal narrowing right MCA M2 segment estimated be 50- 75%. Diffuse irregularity distal MCA branches.  Severe diffuse disease left PCA. Poor visualization of the anterior  inferior cerebellar arteries, otherwise no cerebellar branch occlusion.  IMPRESSION: Intracranial atherosclerotic disease as described. The most severe proximal abnormality involves the right A1 ACA and diffusely in the left PCA.   Original Report Authenticated By: Elsie Stain, M.D.     Assessment/Plan Principal Problem:  *TIA (transient ischemic attack) Active Problems:  DIABETES MELLITUS, TYPE II  B12 DEFICIENCY  HYPERLIPIDEMIA  HYPERTENSION  CORONARY ARTERY DISEASE  CKD (chronic kidney disease), stage IV  CEREBROVASCULAR ACCIDENT, HX OF  Aphasia  ? Hypertensive Encephalopathy vs TIA Patient's BP is elevated, and she was recently taken off lasix by her husband which could have caused her confusion, CT head and MRI brain are negative at this time. Will await the results of carotid doppler and 2 D echo, patient is already on aspirin 81 mg po daily and Plavix 75 mg po daily at home. We have given the dose of 325 mg aspirin in the ER. I called and discussed with Dr Eilleen Kempf, neurology on call.and at this time we will gradually lower her BP and try to keep SBP around 150. Will also give thiamine 100 mg iv daily for next three days.  Diabetes mellitus Will start SSI, and monitor the blood glucose. She has passed the swallow screen and will be started on carb modified diet.  Hypertension Restart Coreg 25 mg po BID Hydralazine 25 mg po q 6 hr prn to keep BP > 160/100  Chronic Kidney diasease stage IV Cr is at baseline. Will continue to monitor,  Hematuria Will obtain the urine culture to r/o UTI  Time Spent on Admission: 70 min  [facility] Pager: [phone] 09/30/2011, 6:17 PM

## 2011-09-30 NOTE — Progress Notes (Signed)
  Echocardiogram 2D Echocardiogram has been performed.  Cathie Beams 09/30/2011, 3:03 PM

## 2011-10-01 ENCOUNTER — Encounter (HOSPITAL_COMMUNITY): Payer: Self-pay | Admitting: *Deleted

## 2011-10-01 DIAGNOSIS — G459 Transient cerebral ischemic attack, unspecified: Secondary | ICD-10-CM

## 2011-10-01 LAB — URINE DRUGS OF ABUSE SCREEN W ALC, ROUTINE (REF LAB)
Barbiturate Quant, Ur: NEGATIVE
Creatinine,U: 108.2 mg/dL
Marijuana Metabolite: NEGATIVE
Methadone: NEGATIVE
Phencyclidine (PCP): NEGATIVE

## 2011-10-01 LAB — GLUCOSE, CAPILLARY
Glucose-Capillary: 140 mg/dL — ABNORMAL HIGH (ref 70–99)
Glucose-Capillary: 156 mg/dL — ABNORMAL HIGH (ref 70–99)

## 2011-10-01 LAB — LIPID PANEL
Cholesterol: 155 mg/dL (ref 0–200)
HDL: 51 mg/dL (ref >39)
Total CHOL/HDL Ratio: 3 RATIO
Triglycerides: 129 mg/dL (ref <150)

## 2011-10-01 LAB — RAPID URINE DRUG SCREEN, HOSP PERFORMED
Barbiturates: NOT DETECTED
Cocaine: NOT DETECTED
Tetrahydrocannabinol: NOT DETECTED

## 2011-10-01 LAB — URINE CULTURE

## 2011-10-01 LAB — HEMOGLOBIN A1C
Hgb A1c MFr Bld: 5.6 %
Mean Plasma Glucose: 114 mg/dL
Mean Plasma Glucose: 117 mg/dL — ABNORMAL HIGH (ref <117)

## 2011-10-01 MED ORDER — FLUDROCORTISONE ACETATE 0.1 MG PO TABS
0.2000 mg | ORAL_TABLET | Freq: Two times a day (BID) | ORAL | Status: DC
  Administered 2011-10-01 – 2011-10-05 (×9): 0.2 mg via ORAL
  Filled 2011-10-01 (×12): qty 2

## 2011-10-01 MED ORDER — VITAMIN B-1 100 MG PO TABS
100.0000 mg | ORAL_TABLET | Freq: Every day | ORAL | Status: DC
  Administered 2011-10-01 – 2011-10-05 (×5): 100 mg via ORAL
  Filled 2011-10-01 (×5): qty 1

## 2011-10-01 MED ORDER — MUPIROCIN 2 % EX OINT
TOPICAL_OINTMENT | Freq: Two times a day (BID) | CUTANEOUS | Status: DC
  Administered 2011-10-01 – 2011-10-04 (×7): via TOPICAL
  Administered 2011-10-04: 1 via TOPICAL
  Filled 2011-10-01 (×2): qty 22

## 2011-10-01 MED ORDER — INSULIN GLARGINE 100 UNIT/ML ~~LOC~~ SOLN
25.0000 [IU] | Freq: Every day | SUBCUTANEOUS | Status: DC
  Administered 2011-10-01 – 2011-10-04 (×4): 25 [IU] via SUBCUTANEOUS

## 2011-10-01 MED ORDER — PARICALCITOL 1 MCG PO CAPS
1.0000 ug | ORAL_CAPSULE | Freq: Every day | ORAL | Status: DC
  Administered 2011-10-01 – 2011-10-05 (×5): 1 ug via ORAL
  Filled 2011-10-01 (×5): qty 1

## 2011-10-01 NOTE — Progress Notes (Signed)
Subjective: [name] with a complex medical history including CVA with loss of vision OD, DM, HTN, hyperlipidemia, CKD IV had an episode of aphasia and confusion on Saturday, August 17th which cleared fairly quickly. On Monday she had an episode of aphasia and marked confusion/disorientation such that she could not be managed at home and was brought to the ED. She did have TIA eval in CDU with negative MRI brain, CT brain, Carotid dopplers and 2D echo. All studies were unremarkable but she continued to be confused, disoriented and was having hallucinosis. Her lasix had been held and he BP had been elevated. Hypertensive encephalopathy was considered as part of her differential.   This AM [name] is conversant but disoriented and inappropriate - talks of dresses and walking out. Her RN reports that she had hallucinosis through the night both visual and auditory. She denies any discomfort, focal weakness, no paresthesia.  She reports that she injured her right foot on "stirrups" or some part of the ED gurney.  Objective: Lab: Lab Results  Component Value Date   WBC 7.3 09/30/2011   HGB 10.8* 09/30/2011   HCT 31.3* 09/30/2011   MCV 94.6 09/30/2011   PLT 140* 09/30/2011   BMET    Component Value Date/Time   NA 140 09/30/2011 1138   K 3.8 09/30/2011 1138   CL 105 09/30/2011 1138   CO2 25 09/30/2011 1138   GLUCOSE 151* 09/30/2011 1138   BUN 23 09/30/2011 1138   CREATININE 3.35* 09/30/2011 1138   CALCIUM 10.8* 09/30/2011 1138   CALCIUM 10.9* 09/11/2011 1000   GFRNONAA 14* 09/30/2011 1138   GFRAA 16* 09/30/2011 1138     Imaging:  Scheduled Meds:   . aspirin  81 mg Oral Daily  . carvedilol  25 mg Oral BID WC  . clopidogrel  75 mg Oral Daily  . insulin aspart  0-9 Units Subcutaneous TID WC  . levothyroxine  75 mcg Oral Daily  . sertraline  100 mg Oral Daily  . simvastatin  20 mg Oral QHS  . thiamine  100 mg Intravenous Daily  . DISCONTD: aspirin  325 mg Oral Daily  . DISCONTD: aspirin   81 mg Oral Daily  . DISCONTD: carvedilol  25 mg Oral BID WC  . DISCONTD: thiamine  100 mg Intravenous Daily   Continuous Infusions:   . DISCONTD: sodium chloride     PRN Meds:.hydrALAZINE, hydrALAZINE   Physical Exam: Filed Vitals:   10/01/11 0642  BP: 210/85  Pulse: 63  Temp: 98.9 F (37.2 C)  Resp: 18  No intake or output data in the 24 hours ending 10/01/11 0736 Wt Readings from Last 3 Encounters:  09/30/11 175 lb 11.3 oz (79.7 kg)  01/07/11 175 lb (79.379 kg)  12/03/10 180 lb (81.647 kg)   BP Readings from Last 3 Encounters:  10/01/11 210/85  09/28/11 124/77  09/26/11 138/82   Gen'l - obese white woman in no acute distress but definitely disoriented HEENT- C&S clear, PERRLA. Right eye droops. Neck - supple Cor- 2+ radial pulse, RRR, no JVD, no carotid bruits PUlm - normal respirations, no rales or wheezes, no increased WOB Abd - morbidly obese, BS+, non-tender MSK - no limb deformity Neuro - Awake, alert, NOT oriented to place, does recognize examiner. Speech is clear. Content is confused. Chronic right eye droop, no new facial asymmetry, EOMI, MS - MAE to command. Cerebellar - no tremor. Derm - laceration at the base/web-space of the right 5th toe.  Assessment/Plan: 1. Mental status changes - patient with confusion, hallucinosis, disorientation. All studies are negative for CNS event. Working diagnosis is TIA vs delerium secondary to HTN vs renal related vs other. She has a h/o delirium in the past: once with reglan, once unexplained. Currently no sign of infection or clear evidence of CVA  Plan Close observation- may need sitter for safety  Treat HTN - see below  Lab: ESR, ANA,   For continued delirium may need psychiatric consult.   2. HTN - will restart lasix at home dose, continue all other BP meds  3. Foot wound - laceration vs tear to base of right 5th toe Plan -  Soak foot in warm water with betadiene 10 minutes, rinse, dry, apply bactroban  ointment - BID  4. CKD - progressive CKD, followed by Dr. Kathrene Bongo  5. DM -  Lab Results  Component Value Date   HGBA1C 5.6 09/30/2011   Good control  Plan - continue home regimen Lantus 25 units qHS plus SS  6. Orthostatic hypotension with syncope - previously worked up. Plan - continue florinef.   Casimiro Needle [facility] ([phone]  10/01/2011, 7:30 AM

## 2011-10-01 NOTE — Care Management Note (Signed)
    Page 1 of 1   10/04/2011     2:54:19 PM   CARE MANAGEMENT NOTE 10/04/2011  Patient:  [name], [name]   Account Number:  [id number]  Date Initiated:  10/01/2011  Documentation initiated by:  Onnie Boer  Subjective/Objective Assessment:   PT WAS ADMITTED WITH AMS AND CONFUSSION     Action/Plan:   PROGRESSION OF CARE AND DISCHARGE PLANNING   Anticipated DC Date:  10/03/2011   Anticipated DC Plan:  SKILLED NURSING FACILITY      DC Planning Services  CM consult      Choice offered to / List presented to:             Status of service:  Completed, signed off Medicare Important Message given?   (If response is "NO", the following Medicare IM given date fields will be blank) Date Medicare IM given:   Date Additional Medicare IM given:    Discharge Disposition:  SKILLED NURSING FACILITY  Per UR Regulation:  Reviewed for med. necessity/level of care/duration of stay  If discussed at Long Length of Stay Meetings, dates discussed:    Comments:  10/04/11 Onnie Boer, RN, BSN 1452 PT IS TO DC TO SNF AT [facility]  10/01/11 Onnie Boer, RN, BSN 1355 PT WAS AT HOME WITH HER SPOUSE PTA.  PT MAY NEED SNF AT DC AS HER HUSBAND IS NOT ABLE TO TAKE CARE OF HER IS SHE IS UNABLE TO ASSIST HIM.  PT WAS ADMITTED WITH AMS.  WILL F/U ON PT/OT EVAL AND DC NEEDS

## 2011-10-01 NOTE — ED Provider Notes (Signed)
History    61yF with laceration to base of R fifth toe. Trouble controlling bleeding at home so came to ED. Mild pain at site. Pt thinks cut it when tripped and caught bottom of foot on threshold. No acute numbness or tingling. No other complaints at this time.  CSN: [id number]  Arrival date & time 09/20/11  2327   First MD Initiated Contact with Patient 09/21/11 0125      Chief Complaint  Patient presents with  . Foot Injury    (Consider location/radiation/quality/duration/timing/severity/associated sxs/prior treatment) HPI  Past Medical History  Diagnosis Date  . Hypertension   . Diabetes mellitus   . Stroke   . Detached retina   . Blind right eye   . Fibroid     Fundal myoma  . Thyroid disease     Hypothyroid  . Kidney dysfunction   . Elevated cholesterol     Past Surgical History  Procedure Date  . Eye surgery   . Cesarean section   . Back surgery   . Appendectomy   . Cholecystectomy   . Carotid artery blockage   . Foot surgery     Family History  Problem Relation Age of Onset  . Hypertension Father   . Diabetes Sister   . Heart disease Sister   . Diabetes Brother   . Hypertension Brother   . Heart disease Brother   . Cancer Mother     Lung cancer    History  Substance Use Topics  . Smoking status: Never Smoker   . Smokeless tobacco: Never Used  . Alcohol Use: No    OB History    Grav Para Term Preterm Abortions TAB SAB Ect Mult Living   1 1 1       1       Review of Systems   Review of symptoms negative unless otherwise noted in HPI.   Allergies  Codeine and Morphine and related  Home Medications  No current outpatient prescriptions on file.  BP 161/99  Pulse 63  Temp 98.1 F (36.7 C) (Oral)  Resp 15  SpO2 96%  Physical Exam  Nursing note and vitals reviewed. Constitutional: She appears well-developed and well-nourished. No distress.  HENT:  Head: Normocephalic and atraumatic.  Eyes: Conjunctivae are normal. Right eye  exhibits no discharge. Left eye exhibits no discharge.  Neck: Neck supple.  Cardiovascular: Normal rate, regular rhythm and normal heart sounds.  Exam reveals no gallop and no friction rub.   No murmur heard. Pulmonary/Chest: Effort normal and breath sounds normal. No respiratory distress.  Abdominal: Soft. She exhibits no distension. There is no tenderness.  Musculoskeletal: She exhibits no edema and no tenderness.  Neurological: She is alert.  Skin: Skin is warm and dry.       Laceration along flexor crease MP joint R fifth toe extending into web space. Mild venous oozing. Neurovascularly intact distally.  Psychiatric: She has a normal mood and affect. Her behavior is normal. Thought content normal.    ED Course  Procedures (including critical care time)  LACERATION REPAIR Performed by: Raeford Razor Authorized by: Raeford Razor Consent: Verbal consent obtained. Risks and benefits: risks, benefits and alternatives were discussed Consent given by: patient Patient identity confirmed: provided demographic data Prepped and Draped in normal sterile fashion Wound explored  Laceration Location: R foot  Laceration Length: 3cm  No Foreign Bodies seen or palpated  Anesthesia: local infiltration  Local anesthetic: lidocaine 2% w epinephrine  Anesthetic total: 2 ml  Irrigation method: syringe Amount of cleaning: standard  Skin closure: single layer  Number of sutures: 4  Technique: simple interupted  Patient tolerance: Patient tolerated the procedure well with no immediate complications.     1. Toe laceration       MDM  61yF with toe lac. Repaired. Wound care and return precautions discussed. Outpt fu for suture removal.         Raeford Razor, MD 10/01/11 [phone]

## 2011-10-01 NOTE — Progress Notes (Signed)
[place], NP called regarding pt's BP 188/82. Pt is asymptomatic. Hydralyzine 25mg  PO given earlier in ED and could not be given until 1am. Schorr ordered to give early dose of Coreg. Will continue to monitor pt's BP.  Salvadore Oxford, RN 09/30/11 [phone]

## 2011-10-02 LAB — GLUCOSE, CAPILLARY
Glucose-Capillary: 119 mg/dL — ABNORMAL HIGH (ref 70–99)
Glucose-Capillary: 119 mg/dL — ABNORMAL HIGH (ref 70–99)
Glucose-Capillary: 60 mg/dL — ABNORMAL LOW (ref 70–99)

## 2011-10-02 LAB — BENZODIAZEPINE, QUANTITATIVE, URINE
Clonazepam metabolite (GC/LC/MS), ur confirm: NEGATIVE ng/mL
Flurazepam GC/MS Conf: NEGATIVE ng/mL
Lorazepam (GC/LC/MS), ur confirm: NEGATIVE ng/mL
Nordiazepam GC/MS Conf: NEGATIVE ng/mL
Oxazepam GC/MS Conf: NEGATIVE ng/mL
Temazepam GC/MS Conf: NEGATIVE ng/mL

## 2011-10-02 NOTE — Progress Notes (Signed)
Nutrition Brief Note  Patient identified on the Malnutrition Screening Tool (MST) report for weight loss, generating a score of 2.   Pt reports may be eating less but has not lost any weight. Pt admits to not following a diabetic diet but is currently not willing to make any changes. Husband at bedside. Noted pt for ST SNF at d/c, will defer any further education attempts to SNF.   Lab Results  Component Value Date   HGBA1C 5.7* 09/30/2011   Lipid Panel     Component Value Date/Time   CHOL 155 10/01/2011 0505   TRIG 129 10/01/2011 0505   HDL 51 10/01/2011 0505   CHOLHDL 3.0 10/01/2011 0505   VLDL 26 10/01/2011 0505   LDLCALC 78 10/01/2011 0505   Body mass index is 30.16 kg/(m^2). Pt meets criteria for obesity class I based on current BMI.   Current diet order is CHO Modified Medium, patient is consuming approximately 60-70% of meals at this time. Labs and medications reviewed.   No nutrition interventions warranted at this time. If nutrition issues arise, please consult RD.   Kendell Bane RD, LDN, CNSC [phone] Pager [phone] After Hours Pager

## 2011-10-02 NOTE — Progress Notes (Signed)
Hypoglycemic Event  CBG: 060  Treatment: 15 GM carbohydrate snack  Symptoms: Sweaty  Follow-up CBG: Time:1645 CBG Result: 081  Possible Reasons for Event: Unknown  Comments/MD notified:No    [name]  Remember to initiate Hypoglycemia Order Set & complete

## 2011-10-02 NOTE — Progress Notes (Signed)
Subjective: Pleasant but not oriented to place or context.  Objective: Lab: Lab Results  Component Value Date   WBC 7.3 09/30/2011   HGB 10.8* 09/30/2011   HCT 31.3* 09/30/2011   MCV 94.6 09/30/2011   PLT 140* 09/30/2011   BMET    Component Value Date/Time   NA 140 09/30/2011 1138   K 3.8 09/30/2011 1138   CL 105 09/30/2011 1138   CO2 25 09/30/2011 1138   GLUCOSE 151* 09/30/2011 1138   BUN 23 09/30/2011 1138   CREATININE 3.35* 09/30/2011 1138   CALCIUM 10.8* 09/30/2011 1138   CALCIUM 10.9* 09/11/2011 1000   GFRNONAA 14* 09/30/2011 1138   GFRAA 16* 09/30/2011 1138     Imaging:  Scheduled Meds:   . aspirin  81 mg Oral Daily  . carvedilol  25 mg Oral BID WC  . clopidogrel  75 mg Oral Daily  . fludrocortisone  0.2 mg Oral BID WC  . insulin aspart  0-9 Units Subcutaneous TID WC  . insulin glargine  25 Units Subcutaneous QHS  . levothyroxine  75 mcg Oral Daily  . mupirocin ointment   Topical BID  . paricalcitol  1 mcg Oral Daily  . sertraline  100 mg Oral Daily  . simvastatin  20 mg Oral QHS  . thiamine  100 mg Oral Daily  . DISCONTD: thiamine  100 mg Intravenous Daily   Continuous Infusions:  PRN Meds:.hydrALAZINE, hydrALAZINE   Physical Exam: Filed Vitals:   10/02/11 0527  BP: 153/65  Pulse: 68  Temp: 98.6 F (37 C)  Resp: 20   Overweight white woman in no distress HEENT- drooping right eye (chronic) Cor- RRR Pulm - normal respirations Neuro - CN II-XII without new deficit. She is disoriented and confused.    Assessment/Plan: 1. Psych - patient with delerium - etiology unclear. Labs were normal, imaging normal. BP is controlled and doubt hypertensive encephalopathy.  Plan Psychiatry consult request called in for evaluation of delerium  2. HTN better control  3. Foot wound - not examined today.  4. CKD - will order follow up lab  5. DM controlled  6. Orthostatic hypotension - meds restarted  7. Dispo - spoke with Louis Matte - he is going through  chemotherapy and he cannot manage her at home.  Plan  ST-SNF. PT/OT eval ordered.   Casimiro Needle Latrell Reitan [facility] - [facility] ([phone]; ([phone]  10/02/2011, 8:09 AM

## 2011-10-03 DIAGNOSIS — I951 Orthostatic hypotension: Secondary | ICD-10-CM

## 2011-10-03 DIAGNOSIS — F29 Unspecified psychosis not due to a substance or known physiological condition: Secondary | ICD-10-CM

## 2011-10-03 DIAGNOSIS — F329 Major depressive disorder, single episode, unspecified: Secondary | ICD-10-CM

## 2011-10-03 DIAGNOSIS — R4182 Altered mental status, unspecified: Secondary | ICD-10-CM

## 2011-10-03 LAB — GLUCOSE, CAPILLARY
Glucose-Capillary: 161 mg/dL — ABNORMAL HIGH (ref 70–99)
Glucose-Capillary: 128 mg/dL — ABNORMAL HIGH (ref 70–99)

## 2011-10-03 MED ORDER — ACETAMINOPHEN 325 MG PO TABS
650.0000 mg | ORAL_TABLET | Freq: Three times a day (TID) | ORAL | Status: DC | PRN
  Administered 2011-10-03 – 2011-10-05 (×2): 650 mg via ORAL
  Filled 2011-10-03 (×3): qty 2

## 2011-10-03 NOTE — Consult Note (Signed)
Patient Identification:  [name] Date of Evaluation:  10/03/2011 Reason for Consult: Delirium  Referring Provider: dr. Debby Lucero  History of Present Illness:Pt is awake, alert and says she had some confusions a few days before admission; she is tangential as she begins telling tat her husband was diagnosed with lung cancer and they were scheduling his treatments.  She becomes lost in her thoughts and stops.  She just know she had to come to the hospital.  Notes confirm her confusion, trying to dress for work in the middle of the night.  She was evaluated for CVA - neg and she had aphasia that cleared over the weekend.  She had Lasix withheld and developed hypertension.    Past Psychiatric History:Anxiety or possibly Panic attacks, Depression  Past Medical History:     Past Medical History  Diagnosis Date  . Hypertension   . Diabetes mellitus   . Stroke   . Detached retina   . Blind right eye   . Fibroid     Fundal myoma  . Thyroid disease     Hypothyroid  . Kidney dysfunction   . Elevated cholesterol        Past Surgical History  Procedure Date  . Eye surgery   . Cesarean section   . Back surgery   . Appendectomy   . Cholecystectomy   . Carotid artery blockage   . Foot surgery     Allergies:  Allergies  Allergen Reactions  . Codeine Nausea Only  . Morphine And Related     "makes her crazy in the head"    Current Medications:  Prior to Admission medications   Medication Sig Start Date End Date Taking? Authorizing Provider  aspirin 81 MG tablet Take 81 mg by mouth daily.     Yes Historical Provider, MD  B Complex-Folic Acid (B-100 BALANCED TR PO) Take 1 tablet by mouth daily.    Yes Historical Provider, MD  carvedilol (COREG) 25 MG tablet TAKE 1 TABLET TWICE A DAY  WITH MEALS 07/01/11  Yes Kendra Navy, MD  clopidogrel (PLAVIX) 75 MG tablet Take 1 tablet (75 mg total) by mouth daily. 03/20/11  Yes Kendra Navy, MD  fludrocortisone (FLORINEF) 0.1 MG  tablet TAKE 2 TABLETS (0.2 MG TOTAL) BY MOUTH 2 (TWO) TIMES DAILY. 08/16/11  Yes Kendra Navy, MD  folic acid (FOLVITE) 1 MG tablet TAKE 1 TABLET BY MOUTH EVERY DAY 08/19/11  Yes Kendra Navy, MD  furosemide (LASIX) 40 MG tablet TAKE 1 TABLET ONCE DAILY 09/15/11  Yes Kendra Navy, MD  insulin glargine (LANTUS) 100 UNIT/ML injection Inject 25 Units into the skin daily. 08/19/11 08/18/12 Yes Kendra Navy, MD  levothyroxine (SYNTHROID, LEVOTHROID) 75 MCG tablet Take 1 tablet (75 mcg total) by mouth daily. 04/09/11 04/08/12 Yes Kendra Navy, MD  potassium chloride SA (K-DUR,KLOR-CON) 20 MEQ tablet Take 40 mEq by mouth 2 (two) times daily.    Yes Historical Provider, MD  sertraline (ZOLOFT) 100 MG tablet Take 1 tablet (100 mg total) by mouth daily. 03/20/11  Yes Kendra Navy, MD  simvastatin (ZOCOR) 20 MG tablet Take 1 tablet (20 mg total) by mouth at bedtime. 03/20/11  Yes Kendra Navy, MD  triazolam (HALCION) 0.25 MG tablet Take 1 tablet (0.25 mg total) by mouth at bedtime. 03/21/11  Yes Kendra Navy, MD  ZEMPLAR 1 MCG capsule Take 1 tablet by mouth Daily. 12/16/10  Yes Historical Provider, MD    Social  History:    reports that she has never smoked. She has never used smokeless tobacco. She reports that she does not drink alcohol or use illicit drugs.   Family History:    Family History  Problem Relation Age of Onset  . Hypertension Father   . Diabetes Sister   . Heart disease Sister   . Diabetes Brother   . Hypertension Brother   . Heart disease Brother   . 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  AXIS IV economic problems, occupational problems, other psychosocial or environmental problems, problems related to social environment and problems with primary support group  AXIS V 51-60 moderate symptoms   Assessment/Plan:Pt is seen for a 40 5 PM 10/02/11. Planned to contact Dr. Debby Lucero in a.m. Patient is awake alert and cooperative with evaluation. She appears apprehensive as though she is concerned about "feeling" to answer properly. She does not know the date but is able to give her address and knows the president is a drama but cannot name the president [name] but does know that [name] and his father were presidents prior to a bar just that the father and the son were presidents she spells world but cannot spell it backwards she tries to calculate serial threes she has the correct answers 17 and she lapses to 15 and and gives up. She cannot remember 3 objects after 5 minutes and she has a very concrete explanation of proverbs. She has some suicidal thoughts and says she gets very anxious. She admits to seeing people in although she can't understand, and she explains that she can hear there by she does not see those people today. She has not been in counseling but agrees that that would be helpful. She denies suicidal/homicidal thoughts. Psychoeducation discussion follows to explain the purpose of slow breathing and imagery at the onset of anxiety. She is encouraged to practice this several times during the day and discovered that she will automatically respond in this manner if she is practiced routinely. This slows respirations, heart rate and blood pressure. It also minimizes the anxiety because she has assigned  her brain activity and concentration to the image she prefers to focus on. During this evaluation she announces that she is feeling sweaty and requests blood sugar check. It is reported as 66 and she is given Coke. He minutes later her blood glucose had increased to 85?  Subjectively she says she is feeling better. Dr. Debby Lucero note indicates that the husband is in treatment for lung cancer and is not able to take her home. RECOMMENDATION: 1.  Discuss with Psych CSW disposition and possible acceptance in [facility]  2.  noted, patient with hyperglycemia he sees RD consultation for diabetes and it is determined she is unwilling to change diet. He  continues to run hyperglycemia. She is aware of her treatment regiment regimen and need to check blood glucose. 3.  Agree with Zoloft dose. For depression/anxiety. 4.  Patient is encouraged to seek outpatient therapy to learn coping skills for her anxiety. 5.  Following information provided in this chart, it is most likely that this patient had a TIA which has caused a disruption in the thought process activity in the brain. She is cognizant of the enough of her history to know general facts and most likely will clear in the next few days from the confusion she has exhibited so far.  6. No further psychiatric needs identified unless requested Psych CSW we'll pursue transfer to SNF      M.D. psychiatrist signs off Lekeith Wulf J. Ferol Luz, MD Psychiatrist  10/03/2011 1:46 AM

## 2011-10-03 NOTE — Progress Notes (Signed)
CSW reviewed chart and noted Dr. Sunny Schlein recommendation for SNF for pt.  CSW informed unit CSW who will pursue placement.  Psych CSW signing off. Daniele Dillow, LCSW (Covering for [employer], LCSW)

## 2011-10-03 NOTE — Progress Notes (Signed)
Subjective: Appreciate Dr. Blenda Peals consult - recommendations reviewed - no new psycho-tropic meds recommended.   She is very pleasant but disturbed that she isn't quite herself. No distress or pain  Objective: Lab: Lab Results  Component Value Date   WBC 7.3 09/30/2011   HGB 10.8* 09/30/2011   HCT 31.3* 09/30/2011   MCV 94.6 09/30/2011   PLT 140* 09/30/2011   BMET    Component Value Date/Time   NA 140 09/30/2011 1138   K 3.8 09/30/2011 1138   CL 105 09/30/2011 1138   CO2 25 09/30/2011 1138   GLUCOSE 151* 09/30/2011 1138   BUN 23 09/30/2011 1138   CREATININE 3.35* 09/30/2011 1138   CALCIUM 10.8* 09/30/2011 1138   CALCIUM 10.9* 09/11/2011 1000   GFRNONAA 14* 09/30/2011 1138   GFRAA 16* 09/30/2011 1138     Imaging: no new imaging  Scheduled Meds:   . aspirin  81 mg Oral Daily  . carvedilol  25 mg Oral BID WC  . clopidogrel  75 mg Oral Daily  . fludrocortisone  0.2 mg Oral BID WC  . insulin aspart  0-9 Units Subcutaneous TID WC  . insulin glargine  25 Units Subcutaneous QHS  . levothyroxine  75 mcg Oral Daily  . mupirocin ointment   Topical BID  . paricalcitol  1 mcg Oral Daily  . sertraline  100 mg Oral Daily  . simvastatin  20 mg Oral QHS  . thiamine  100 mg Oral Daily   Continuous Infusions:  PRN Meds:.hydrALAZINE, hydrALAZINE   Physical Exam: Filed Vitals:   10/03/11 0538  BP: 151/66  Pulse: 62  Temp: 97.7 F (36.5 C)  Resp: 20  overweight white woman  Cor- RRR Pulm - normal respirations Abd - soft Neuro/psych - persistently but mildly confused, cognitively slowed.      Assessment/Plan: 1. Psych - appreciate consult: post-TIA mental confusion seems reasonable and hopefully this will clear.  Plan- continue orienting her to per,place, content  Continue zoloft  2. HTN reasonable control  Plan Continue medications  3. Foot wound -  Persistent tear at base of 5th digit right  Plan Continue foot soaks! Continue bactroban.place spacer (rolled 2x2) between  5th and 4th toe to try and keep this open a little.  4. CKD - stable  5. DM -  Lab Results  Component Value Date   HGBA1C 5.7* 09/30/2011   Continue present regimen  7. Dispo - ST-SNF   Casimiro Needle Lakrisha Iseman [place] [facility] - [facility] ([phone]; ([phone]  10/03/2011, 7:30 AM

## 2011-10-03 NOTE — Evaluation (Signed)
Physical Therapy Evaluation Patient Details Name: [name] MRN: [id number] DOB: 1951/02/11 Today's Date: 10/03/2011 Time: 1145-1200 PT Time Calculation (min): 15 min  PT Assessment / Plan / Recommendation Clinical Impression  Pt is a 61 yo female who was admitted s/p TIA. Pt presented to physical therapy evaluation with decreased balance and functional mobility. Formal MMT's not performed due to when pt sat up on EOB, stated urgent need to urinate. Pt became tearful multiple times throughout the session, stating she didn't want "you to change your life around for me." It was explained to pt that the reason PT was there was to help her, and pt verbalized understanding. Pt also tearful about possiblity of having to purchase more DME but pt was assured that it would be addressed only if needed. Recommending ST-SNF for rehab PT.    PT Assessment  Patient needs continued PT services    Follow Up Recommendations  Skilled nursing facility;Supervision for mobility/OOB (ST-SNF)    Barriers to Discharge Decreased caregiver support      Equipment Recommendations  None recommended by PT (Pt reports owning RW)    Recommendations for Other Services     Frequency Min 3X/week    Precautions / Restrictions Precautions Precautions: Fall Restrictions Weight Bearing Restrictions: No         Mobility  Bed Mobility Bed Mobility: Supine to Sit;Sitting - Scoot to Edge of Bed Supine to Sit: 6: Modified independent (Device/Increase time) Sitting - Scoot to Edge of Bed: 6: Modified independent (Device/Increase time) Details for Bed Mobility Assistance: Pt is modified independent with bed mobility. Transfers Transfers: Sit to Stand;Stand to Sit Sit to Stand: 4: Min guard Stand to Sit: 4: Min guard Details for Transfer Assistance: Pt is min guard for safety due to pt's decreased mobility requiring verbal cueing for safe hand placement (pushing up from bed, not grabbing on  walker). Ambulation/Gait Ambulation/Gait Assistance: 4: Min guard Ambulation Distance (Feet): 20 Feet Assistive device: Rolling walker Ambulation/Gait Assistance Details: Pt is min guard for safety and required verbal cueing for proper walker use. Gait Pattern: Trunk flexed;Decreased trunk rotation Gait velocity: decreased General Gait Details: Pt with bil decreased step height. Stairs: No    Exercises     PT Diagnosis: Difficulty walking;Abnormality of gait  PT Problem List: Decreased strength;Decreased activity tolerance;Decreased balance;Decreased mobility;Decreased cognition;Decreased knowledge of use of DME;Decreased safety awareness PT Treatment Interventions: DME instruction;Gait training;Stair training;Functional mobility training;Therapeutic activities;Balance training;Therapeutic exercise;Neuromuscular re-education;Patient/family education   PT Goals Acute Rehab PT Goals PT Goal Formulation: With patient Time For Goal Achievement: 10/17/11 Potential to Achieve Goals: Good Pt will go Sit to Stand: with supervision PT Goal: Sit to Stand - Progress: Goal set today Pt will go Stand to Sit: with supervision PT Goal: Stand to Sit - Progress: Goal set today Pt will Ambulate: 51 - 150 feet;with supervision;with least restrictive assistive device PT Goal: Ambulate - Progress: Goal set today  Visit Information  Last PT Received On: 10/03/11 Assistance Needed: +1    Subjective Data  Subjective: "I am going through a lot right now."   Prior Functioning       Cognition  Overall Cognitive Status: Impaired Area of Impairment: Attention;Safety/judgement;Following commands Arousal/Alertness: Awake/alert Orientation Level: Appears intact for tasks assessed Behavior During Session: [facility] for tasks performed Current Attention Level: Sustained Attention - Other Comments: Pt continually having to be redirected back to relevant conversation topics/answers to questions. Following  Commands: Other (comment);Follows multi-step commands with increased time (and often requires redirection abck  to task) Safety/Judgement: Decreased safety judgement for tasks assessed;Decreased awareness of safety precautions Cognition - Other Comments: emotional; a bit irrational    Extremity/Trunk Assessment Right Upper Extremity Assessment RUE ROM/Strength/Tone: [facility] for tasks assessed Left Upper Extremity Assessment LUE ROM/Strength/Tone: [facility] for tasks assessed Right Lower Extremity Assessment RLE ROM/Strength/Tone: [facility] for tasks assessed Left Lower Extremity Assessment LLE ROM/Strength/Tone: [facility] for tasks assessed   Balance Balance Balance Assessed: No  End of Session PT - End of Session Equipment Utilized During Treatment: Gait belt Activity Tolerance: Patient tolerated treatment well Patient left: in chair;with call bell/phone within reach Nurse Communication: Mobility status  GP     Naureen Benton 10/03/2011, 4:00 PM

## 2011-10-03 NOTE — Progress Notes (Signed)
Visited pt room per her request.  Introduced myself to pt who was in the chair, and offered pastoral presence and support.  Shared conversation regarding d/c planning and her concerns to be home to support husband who is in out-patient treatment for cancer.  Pt was teary-eyed at points during our visit.  Offered support and encouragement to her, and focused on her joint decisions with husband and with dr's recommendations.  Other pastoral conversation also around faith issues.  Offered to pray for pt, which she accepted.  Pt thanked me for my time and visit.  I offered to be available as needed.   10/03/11 1300  Clinical Encounter Type  Visited With Patient  Visit Type Spiritual support  Referral From Nurse  Spiritual Encounters  Spiritual Needs Prayer;Emotional  Stress Factors  Patient Stress Factors Health changes

## 2011-10-03 NOTE — Evaluation (Signed)
Occupational Therapy Evaluation Patient Details Name: SHARANYA TEMPLIN MRN: [id number] DOB: 11/03/1950 Today's Date: 10/03/2011 Time: 4098-1191 OT Time Calculation (min): 21 min  OT Assessment / Plan / Recommendation Clinical Impression  This 61 y.o. female admitted for AMS. Pt. demonstrates the below listed deficts and will benefit from OT to maximize safety and independence with BADLs to allow pt. to return to independent level after SNF level rehab    OT Assessment  Patient needs continued OT Services    Follow Up Recommendations  Skilled nursing facility    Barriers to Discharge Decreased caregiver support    Equipment Recommendations  None recommended by OT    Recommendations for Other Services    Frequency  Min 2X/week    Precautions / Restrictions Precautions Precautions: Fall       ADL  Eating/Feeding: Simulated;Independent Where Assessed - Eating/Feeding: Bed level Grooming: Performed;Wash/dry hands;Supervision/safety Where Assessed - Grooming: Supported standing Upper Body Bathing: Simulated;Set up Where Assessed - Upper Body Bathing: Supported sitting Lower Body Bathing: Simulated;Min guard Where Assessed - Lower Body Bathing: Unsupported sit to stand Upper Body Dressing: Simulated;Set up Where Assessed - Upper Body Dressing: Unsupported sitting Lower Body Dressing: Simulated;Performed;Min guard Where Assessed - Lower Body Dressing: Unsupported sit to stand Toilet Transfer: Performed;Min guard Toilet Transfer Method: Sit to Barista: Comfort height toilet Toileting - Clothing Manipulation and Hygiene: Performed;Min guard Where Assessed - Engineer, mining and Hygiene: Sit to stand from 3-in-1 or toilet Equipment Used: Rolling walker Transfers/Ambulation Related to ADLs: min guard assist.  Pt. tends to pick up walker when ambulating ADL Comments: Pt. requires min verbal cues to stay on task.    OT Diagnosis:  Generalized weakness;Cognitive deficits  OT Problem List: Decreased strength;Decreased activity tolerance;Impaired balance (sitting and/or standing);Impaired vision/perception;Decreased cognition;Decreased knowledge of use of DME or AE;Decreased safety awareness OT Treatment Interventions: Self-care/ADL training;DME and/or AE instruction;Therapeutic activities;Patient/family education;Balance training;Cognitive remediation/compensation   OT Goals Acute Rehab OT Goals OT Goal Formulation: With patient Time For Goal Achievement: 10/24/11 Potential to Achieve Goals: Good ADL Goals Pt Will Perform Grooming: with modified independence;Standing at sink ADL Goal: Grooming - Progress: Goal set today Pt Will Perform Upper Body Bathing: with modified independence;Sitting, chair ADL Goal: Upper Body Bathing - Progress: Goal set today Pt Will Perform Lower Body Bathing: with modified independence;Sit to stand from chair;Sit to stand from bed ADL Goal: Lower Body Bathing - Progress: Goal set today Pt Will Perform Upper Body Dressing: with modified independence;Sitting, chair ADL Goal: Upper Body Dressing - Progress: Goal set today Pt Will Perform Lower Body Dressing: with modified independence;Sit to stand from bed;Sit to stand from chair ADL Goal: Lower Body Dressing - Progress: Progressing toward goals Pt Will Transfer to Toilet: with modified independence;Ambulation;Comfort height toilet ADL Goal: Toilet Transfer - Progress: Goal set today Pt Will Perform Toileting - Clothing Manipulation: with modified independence;Standing ADL Goal: Toileting - Clothing Manipulation - Progress: Goal set today Pt Will Perform Toileting - Hygiene: with modified independence;Sit to stand from 3-in-1/toilet ADL Goal: Toileting - Hygiene - Progress: Goal set today  Visit Information  Last OT Received On: 10/03/11 Assistance Needed: +1    Subjective Data  Subjective: "I'm dealing with a lot right now!" Patient  Stated Goal: Pt did not state   Prior Functioning  Vision/Perception  Home Living Available Help at Discharge: Skilled Nursing Facility Prior Function Level of Independence: Needs assistance Needs Assistance: Bathing;Meal Prep;Light Housekeeping Bath: Minimal Meal Prep: Minimal Light Housekeeping: Minimal Able to  Take Stairs?: Yes Driving: No Vocation: Retired Musician: No difficulties Dominant Hand: Right   Vision - Assessment Vision Assessment: Vision tested Perception Perception: Within Functional Limits Praxis Praxis: Intact  Cognition  Overall Cognitive Status: Impaired Area of Impairment: Attention;Safety/judgement;Following commands;Awareness of errors;Awareness of deficits;Problem solving;Memory Arousal/Alertness: Awake/alert Orientation Level: Appears intact for tasks assessed Behavior During Session: Other (comment) (irritable) Current Attention Level: Sustained Attention - Other Comments: Pt. is very tangetial and self distracts Memory Deficits: Pt. with decreased recall of information just presented - may be a result of poor attention Following Commands: Follows multi-step commands with increased time Safety/Judgement: Decreased safety judgement for tasks assessed;Impulsive;Decreased awareness of need for assistance Awareness of Errors: Assistance required to correct errors made Problem Solving: Requires min verbal cues to problem solve through tasks    Extremity/Trunk Assessment Right Upper Extremity Assessment RUE ROM/Strength/Tone: [facility] for tasks assessed RUE Coordination: WFL - gross/fine motor Left Upper Extremity Assessment LUE ROM/Strength/Tone: WFL for tasks assessed LUE Coordination: WFL - gross/fine motor Trunk Assessment Trunk Assessment: Normal   Mobility Bed Mobility Bed Mobility: Supine to Sit;Sitting - Scoot to Edge of Bed;Sit to Supine Supine to Sit: 6: Modified independent (Device/Increase time) Sitting - Scoot to Edge  of Bed: 6: Modified independent (Device/Increase time) Sit to Supine: 6: Modified independent (Device/Increase time) Transfers Transfers: Sit to Stand;Stand to Sit Sit to Stand: 4: Min guard Stand to Sit: 4: Min guard   Exercise    Balance    End of Session OT - End of Session Activity Tolerance: Patient tolerated treatment well Patient left: in bed;with call bell/phone within reach;with bed alarm set (sitter outside room observing pt)  GO     Mearl Olver, Ursula Alert M 10/03/2011, 6:28 PM

## 2011-10-03 NOTE — Evaluation (Signed)
I have read and agree with the above assessment and plan. Micajah Dennin Helen Whitlow PT, DPT Pager: [phone] 

## 2011-10-03 NOTE — Progress Notes (Addendum)
Progress Note s/p Consultation.  Pt is awake and alert.  She is oriented to person and situation.  She does not know place or date.  She engages in conversation and is cooperative.  She has good eye contact . This patient has concern that she has not "theref for her husband".  This brings up the topic of home management. She is aware that he is going through chemotherapy provider for regimen that is recommended for lung cancer. She also acknowledges that he is very caring and would help her in any way. She is also aware that he will need time to recover from whichever treatment is given and that if she has any needs it would be great difficulty for him to deal with his treatment and also address be available to care for her. Therefore, it is suggested that she consider spending of few days in SNF. To regain her strength and ability to carry on her ADLs.  This patient begins to feel emotional. She says she worries about her daughter whom she loves very much and worries how she will manage in her married life once she and her husband are dead.   Several times during this conversation she begins to say something and appears to be lost in the thought.  She appears to be exasperated and declares I. don't know what I was saying. She knows that her daughter is married and that they are very happy and yet she very worried about her welfare. She acknowledges she has not had to face these realities before. She is alluding to her questions about how she will manage on her own should her husband died from his lung cancer. These are very pertinent concerns and she is asked if she would like to have a chaplain visit. She said that before her illness with diabetes she would go to the [facility] and if a Catering manager as available she would appreciate that.   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  Patient has capacity to make decisions regarding her care following discharge. 2.  Request visit by chaplain. (Request is made at the front desk) 3.   Transfer to SNF for strength, stability and independence when medically stable 4.  Provide patient with psychiatric psychiatrist and therapist for outpatient emotional support 5.  No further psychiatric needs unless requested. M.D. psychiatrist signs off Kayston Jodoin J. Ferol Luz, MD Psychiatrist  10/03/2011 11:06 PM

## 2011-10-03 NOTE — Clinical Social Work Note (Signed)
CSW met with pt and husband to address SNF placement as recommended by PT. CSW explained the process of SNF placement. Pt and husband are agreeable to discharge plan to SNF. CSW will initiate SNF referral in [facility] and follow up with bed offers.   Dede Query, MSW, Theresia Majors [phone]

## 2011-10-04 LAB — GLUCOSE, CAPILLARY: Glucose-Capillary: 74 mg/dL (ref 70–99)

## 2011-10-04 MED ORDER — INSULIN GLARGINE 100 UNIT/ML ~~LOC~~ SOLN: 20.0000 [IU] | Freq: Every day | SUBCUTANEOUS | Status: DC

## 2011-10-04 MED ORDER — CLONIDINE HCL 0.1 MG PO TABS
0.1000 mg | ORAL_TABLET | Freq: Once | ORAL | Status: AC
  Administered 2011-10-05: 0.1 mg via ORAL
  Filled 2011-10-04: qty 1

## 2011-10-04 MED ORDER — MUPIROCIN 2 % EX OINT: TOPICAL_OINTMENT | Freq: Two times a day (BID) | CUTANEOUS | Status: AC

## 2011-10-04 NOTE — Progress Notes (Signed)
Inpatient Diabetes Program Recommendations  AACE/ADA: New Consensus Statement on Inpatient Glycemic Control  Target Ranges:  Prepandial:   less than 140 mg/dL      Peak postprandial:   less than 180 mg/dL (1-2 hours)      Critically ill patients:  140 - 180 mg/dL  Pager:  [phone] Hours:  8 am-10pm   Reason for Visit: Hypoglycemia: 55 mg/dL  Inpatient Diabetes Program Recommendations  Insulin - Basal: Decrease Lantus to 20 units qhs  Alfredia Client PhD, RN, BC-ADM Diabetes Coordinator  Office:  [phone] Team Pager:  [phone]

## 2011-10-04 NOTE — Progress Notes (Signed)
Patient is doing well and is medically stable for transfer to SNF when bed available. FL-2 signed. Dictated # [id number]

## 2011-10-04 NOTE — Progress Notes (Signed)
Subjective: Patient is dong well - stable. Medically stable for transfer to SNF  Objective: Lab: Lab Results  Component Value Date   WBC 7.3 09/30/2011   HGB 10.8* 09/30/2011   HCT 31.3* 09/30/2011   MCV 94.6 09/30/2011   PLT 140* 09/30/2011   BMET    Component Value Date/Time   NA 140 09/30/2011 1138   K 3.8 09/30/2011 1138   CL 105 09/30/2011 1138   CO2 25 09/30/2011 1138   GLUCOSE 151* 09/30/2011 1138   BUN 23 09/30/2011 1138   CREATININE 3.35* 09/30/2011 1138   CALCIUM 10.8* 09/30/2011 1138   CALCIUM 10.9* 09/11/2011 1000   GFRNONAA 14* 09/30/2011 1138   GFRAA 16* 09/30/2011 1138     Imaging:  Scheduled Meds:   . aspirin  81 mg Oral Daily  . carvedilol  25 mg Oral BID WC  . clopidogrel  75 mg Oral Daily  . fludrocortisone  0.2 mg Oral BID WC  . insulin aspart  0-9 Units Subcutaneous TID WC  . insulin glargine  25 Units Subcutaneous QHS  . levothyroxine  75 mcg Oral Daily  . mupirocin ointment   Topical BID  . paricalcitol  1 mcg Oral Daily  . sertraline  100 mg Oral Daily  . simvastatin  20 mg Oral QHS  . thiamine  100 mg Oral Daily   Continuous Infusions:  PRN Meds:.acetaminophen, hydrALAZINE, hydrALAZINE   Physical Exam: Filed Vitals:   10/04/11 1025  BP: 151/62  Pulse: 62  Temp: 98.5 F (36.9 C)  Resp: 18        Assessment/Plan: Patient medically stable for transfer to SNF. D/C summary dictated.   Illene Regulus Mackey IM (o) [phone]; (c) [phone] Call-grp - Patsi Sears IM Tele: [phone]  10/04/2011, 1:49 PM

## 2011-10-04 NOTE — Progress Notes (Signed)
Hypoglycemic Event  CBG: 55  Treatment: 15 GM carbohydrate snack  Symptoms: Sweaty  Follow-up CBG: Time:0730 CBG Result:74  Possible Reasons for Event: Unknown  Comments/MD notified:Pt no longer sweaty    Danyka Merlin, Constance Holster  Remember to initiate Hypoglycemia Order Set & complete

## 2011-10-04 NOTE — Clinical Social Work Note (Signed)
CSW met with pt to provide bed offers. CSW left the list of current offers for her review and to discuss with husband. CSW will follow up with husband regarding bed offers as he was not present at bed side. FL2 is on pt's chart awaiting MD signature. Awaiting PASARR#. CSW will continue to follow.   Dede Query, MSW, Theresia Majors [phone]

## 2011-10-04 NOTE — Clinical Social Work Note (Signed)
Pt is ready for discharge, however discharge was delayed due to summary was not ready in time for a discharge today. However facility is able to accept pt tomorrow. Discharge summary was sent to facility when available. Weekend CSW will facilitate discharge to [facility] in the AM. Pt and family are agreeable.   Dede Query, MSW, Theresia Majors [phone]

## 2011-10-04 NOTE — Discharge Summary (Signed)
Kendra, Lucero           ACCOUNT NO.:  [id number]  MEDICAL RECORD NO.:  [id number]  LOCATION:  [id number]                        FACILITY:  [facility]  PHYSICIAN:  [name], MD  DATE OF BIRTH:  October 05, 1950  DATE OF ADMISSION:  09/30/2011 DATE OF DISCHARGE:  10/04/2011                              DISCHARGE SUMMARY   ADMITTING DIAGNOSES: 1. Transient ischemic attack. 2. Hypertension, question of encephalopathy. 3. Diabetes. 4. Hyperlipidemia. 5. Hypertension. 6. Coronary artery disease. 7. Chronic kidney disease, stage IV.  DISCHARGE DIAGNOSES: 1. Transient ischemic attack. 2. Hypertension, question of encephalopathy. 3. Diabetes. 4. Hyperlipidemia. 5. Hypertension. 6. Coronary artery disease. 7. Chronic kidney disease, stage IV.  CONSULTANTS:  Mickeal Skinner, MD, for Psychiatry.  PROCEDURES: 1. On September 30, 2011, CT of the head without contrast, which showed     no acute intracranial abnormality.  Stable chronic small vessel     disease.  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 2. Chest x-ray 2-view, August 19, which showed no active     cardiopulmonary disease. 3. MR of the brain, August 19, which showed chronic changes with     atrophy with small vessel disease and remote infarct.  No acute     intracranial findings. 4. MRA, which showed intracranial atherosclerotic disease.  More     severe was proximal abnormality involving the right A1 ACA and     diffusely in the left PCA.  HISTORY OF PRESENT ILLNESS:  Ms. 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  She also has significant problems with diabetes, hypertension, hyperlipidemia.  The patient was in her usual state of health until Saturday prior to admission where she seemed to have a change in mental status, which resolved.  On the day of  admission around 4:00 a.m., the patient awoke early, got up, got dressed, and got ready to go to work. She seemed to be totally disoriented.  She had no slurred speech.  She had no syncope.  She had no seizure.  The patient had stopped her Lasix for several days and in the emergency department, she was found to have significant hypertension.  She was brought to the emergency department for evaluation of her confusion and mental status change.  CT and MRI were negative as above.  The patient had no focal neurologic symptoms, however, she was disoriented and confused.  Because of these changes, she was admitted to the hospital.  Please see the H and P and past epic notes for past medical history, family history, social history, and admission exam.  HOSPITAL COURSE:  The patient was admitted to a telemetry unit.  1. Neuro/psych.  The patient with persistent confusion.  She would     have clear speech, but was disoriented, could not give the date was     disoriented as to place.  She had difficulty caring out a     conversation with long pauses with distraction.  Because of her     persistent symptoms and negative neurologic evaluation including     negative imaging  studies, she was seen in consultation by Dr.     Ferol Luz for Psychiatry.  It was felt the patient was having mental     status changes/confusion that were associated with a probable     transient ischemic attack.  No psychiatric medications were     recommended and it was felt the patient would clear.  It was felt     that given the patient's confusion, which would cause difficulties     in her managing her own care and with her husband having active     lung cancer getting chemotherapy was felt, she would benefit from a     short-term skilled care.  The patient was seen by PT and OT, who felt the patient did have some balance and mobility issues and had some issues in regards to managing her at ADLs, and it was recommended  that she be considered for short- term skilled care.  With the patient having stabilized with no evidence of acute neural neurologic injury, she is now stable and ready for transfer to a skilled care facility.  1. Hypertension.  The patient's blood pressures have been well     controlled.  She has been continued on her home medications, and     they are administered a given on a regular basis.  She will     continue on these medications at discharge without change. 2. Foot wound.  The patient had a small tear at the base of the fifth     digit of the right foot.  There is no evidence of infection.  She     has been having daily wound care with Betadine and water foot soaks     followed by rinsing dry hip application of Bactroban and then a     spacer to try keep the toe elevated.  This treatment should     continue as an outpatient. 3. Chronic kidney disease.  The patient's creatinine has been closely     monitored.  She has been stable. 4. Diabetes.  The patient had an A1c on August, 19, 2013 at 5.7%.  She     did have several episodes of hypoglycemia while in hospital.  Plan     is to decrease her Lantus at time of discharge to 20 units at     bedtime and otherwise continue her home regimen.  DISCHARGE EXAMINATION:  VITAL SIGNS:  Temperature was 98.5, blood pressure 151/62, pulse 62, respirations 18, O2 sats 100% on room air. GENERAL APPEARANCE:  This is an obese Caucasian woman, who is in no acute distress. HEENT:  Normocephalic, atraumatic without battle signs or raccoon's eyes.  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  Left eye appears normal with conjunctiva and sclera being clear pupils round reactive. Oropharynx without lesions. NECK:  Supple with no thyromegaly. NODES:  No adenopathy was noted in the supraclavicular region or cervical region. CHEST:  No deformities are noted. PULMONARY:  The patient has  normal respirations with no increased work of breathing.  No wheezes or rales are noted. BREASTS:  Deferred. CARDIOVASCULAR:  2+ radial pulse.  She had a quiet precordium with a regular rate and rhythm.  No JVD, no carotid bruits are appreciated. ABDOMEN:  Obese, soft, with positive bowel sounds.  No guarding or tenderness. PELVIC:  Deferred. RECTAL:  Deferred. EXTREMITIES:  No clubbing, cyanosis nor edema was noted. DERM:  The patient has a tear at the base on the plantar aspect of the right fifth toe.  There is no drainage.  There is no exudate.  There is no surrounding erythema. NEURO:  The patient is awake.  Her speech is clear.  She does recognize the examiner.  Her husband is present and she recognizes her husband. She does realize that she will be transferred to a skilled care facility for short-term rehab.  FINAL LABORATORY:  A1c 5.7% is noted.  Final chemistries from August 19 with a sodium of 140, potassium 3.8, chloride 105, CO2 of 25, BUN of 23, creatinine 3.35, calcium was 10.8, glucose 151.  Lipid panel was repeated twice, most recently on August 20, with a cholesterol of 155, triglycerides 129, HDL 51, LDL 78.  Final CBC from August 19 with a white count of 7300, hemoglobin 10.8 g, which is chronic.  Platelet count 140,000, normal differential.  INR on August 19, was 1.16.  TSH from September 26, 2011, was normal range at 2.41.  Urinalysis from August 19 with a specific gravity of 1.013, pH of 5.5, glucose of 250, moderate hemoglobin was noticed, 7 to 10 rbc's per high-power field, 0 to 2 wbc's per high-power field, rare bacteria.  Urine culture from August 20, was no growth.  DISPOSITION:  The patient will be transferred to a skilled care facility.  She will need PT and OT evaluation and treatment.  She may follow all of the facility protocols.  She may have leave of absence with her family.  WOUND CARE:  The patient will need to have Betadine soaks to her right foot  twice daily followed by rinsing, drying, application of Bactroban and use a spacer to try to keep the toe elevated off of the lesion.  FOLLOWUP:  The patient will be seen in the office for followup within 1 week at discharge from skilled care facility.  The patient's condition at time of discharge dictation is medically stable.     Rosalyn Gess Norins, MD     MEN/MEDQ  D:  10/04/2011  T:  10/04/2011  Job:  [id number]

## 2011-10-04 NOTE — Clinical Social Work Placement (Signed)
    Clinical Social Work Department CLINICAL SOCIAL WORK PLACEMENT NOTE 10/04/2011  Patient:  [name], [name]  Account Number:  [id number] Admit date:  09/30/2011  Clinical Social Worker:  Peggyann Shoals  Date/time:  10/04/2011 11:43 AM  Clinical Social Work is seeking post-discharge placement for this patient at the following level of care:   SKILLED NURSING   (*CSW will update this form in Epic as items are completed)   10/03/2011  Patient/family provided with [facility] Department of Clinical Social Work's list of facilities offering this level of care within the geographic area requested by the patient (or if unable, by the patient's family).  10/03/2011  Patient/family informed of their freedom to choose among providers that offer the needed level of care, that participate in Medicare, Medicaid or managed care program needed by the patient, have an available bed and are willing to accept the patient.  10/03/2011  Patient/family informed of [facility]' ownership interest in [facility], as well as of the fact that they are under no obligation to receive care at this facility.  PASARR submitted to EDS on 10/03/2011 PASARR number received from EDS on 10/03/2011  FL2 transmitted to all facilities in geographic area requested by pt/family on  10/03/2011 FL2 transmitted to all facilities within larger geographic area on   Patient informed that his/her managed care company has contracts with or will negotiate with  certain facilities, including the following:     Patient/family informed of bed offers received:  10/04/2011 Patient chooses bed at [facility] Physician recommends and patient chooses bed at  SNF  Patient to be transferred to [facility] on 10/05/2011  Patient to be transferred to facility by PTAr  The following physician request were entered in Epic:   Additional Comments:

## 2011-10-05 NOTE — Progress Notes (Signed)
Pt to transfer to [facility] today via [facility].  CSW notified pt's husband, Alonza Bogus, of d/c and he plans on meeting pt at facility. D/C packet complete with chart copy and signed FL2. CSW signing off as no other CSW needs identified at this time.   Dellie Burns, MSW, Theresia Majors [phone] (Weekends 8:00am-4:30pm) For on-call Clinical Social Work:  [email or web address] Password: [email or web address]

## 2011-10-05 NOTE — Progress Notes (Signed)
Pt BP 190/74 manually at 2200. PRN Hydralazine given and BP was rechecked within an hour.  BP still remained elevated at 192/86. Called the doctor and got an order for a 1x dose of Clonidine. BP came down to 163/68 after Clonidine dose.

## 2011-10-08 ENCOUNTER — Other Ambulatory Visit (HOSPITAL_COMMUNITY): Payer: Self-pay | Admitting: *Deleted

## 2011-10-09 ENCOUNTER — Encounter (HOSPITAL_COMMUNITY): Payer: Medicare Other

## 2011-11-05 ENCOUNTER — Telehealth: Payer: Self-pay | Admitting: Internal Medicine

## 2011-11-05 NOTE — Telephone Encounter (Signed)
Pt is requesting a refill on Triazolam.  The pharmarcy fax is [phone].  The confirmation is [id number].

## 2011-11-05 NOTE — Telephone Encounter (Signed)
1. Needs post NH follow up visit tomorrow (wednesday or next Monday) to sort out medications.  2. The home situation is a real problem because [name] is not really able to care for her as he did. There is one daughter and the care of two parents may be too much. I do not know what I have to offer: he may be a hospice candidate but she is not. Long term care for her in assisted living may be beyond there financial ability. Suggestions are welcomed.

## 2011-11-05 NOTE — Telephone Encounter (Signed)
Home health nursing nurse from [facility], called concern of medicatons for patient. Was discharged Friday from nursing home,. Has had a TIA, memory issues short and long term, retaining and understanding  Problem, . Medications not given at nursing care facility of concern are Norvasc Rx not given...potassium given was 40 meq 2 x daily instead of on medication bottle is 20 meq 2 x daily. Did not receive ambien in the nursing home. And does not think she is a candidate for her Rx of triazolam/halcyon. States this is a bad situation as Mr. Scheib is not able to take care of [place] either and there is no one helping them.. Please advise. Chris call Back # [phone]

## 2011-11-06 NOTE — Telephone Encounter (Signed)
APPT ON MON. SEPT 30

## 2011-11-07 ENCOUNTER — Telehealth: Payer: Self-pay | Admitting: *Deleted

## 2011-11-07 NOTE — Telephone Encounter (Signed)
HOME HEALTH NURSE VISIT TODAY CALLED TO VERIFY MEDICATION NORVASC FOR PATIENT HUSBAND. HE DID NOT KNOW PATIENT WAS ON THAT AND HAS NOT BEEN GIVING SINCE SHE WAS DICHARGED FROM REHAB. DR. Kevan Ny HAD PATIENT ON NORVASC WHILE IN REHAB. PATIENT HAS NOT HAD MEDICATION SINCE Friday. HOME HEALTH NURSE EXPLAINED WAS ON MS. Harrower MEDICATION LIST FROM REHAB AND IS IMPORTANT FOR HER TO TAKE FOR HER BLOOD PRESSURE. WILL EXPLAIN TO MR. Pint TO CONTINUE MEDICATION AND TO KEEP APPT. WITH DR. Debby Bud ON Monday TO DISCUSS THIS WITH DR. Debby Bud., HOME HEALTH NURSE STATES PATIENT BLOOD PRESSURE THIS AM IS 160/90.  HEART RATE 70. 11/07/2011  11;25

## 2011-11-11 ENCOUNTER — Other Ambulatory Visit (INDEPENDENT_AMBULATORY_CARE_PROVIDER_SITE_OTHER): Payer: Medicare Other

## 2011-11-11 ENCOUNTER — Encounter: Payer: Self-pay | Admitting: Internal Medicine

## 2011-11-11 ENCOUNTER — Ambulatory Visit (INDEPENDENT_AMBULATORY_CARE_PROVIDER_SITE_OTHER): Payer: Medicare Other | Admitting: Internal Medicine

## 2011-11-11 VITALS — BP 160/84 | HR 63 | Temp 98.2°F | Resp 16 | Wt 178.0 lb

## 2011-11-11 DIAGNOSIS — I10 Essential (primary) hypertension: Secondary | ICD-10-CM

## 2011-11-11 DIAGNOSIS — N184 Chronic kidney disease, stage 4 (severe): Secondary | ICD-10-CM

## 2011-11-11 DIAGNOSIS — E538 Deficiency of other specified B group vitamins: Secondary | ICD-10-CM

## 2011-11-11 DIAGNOSIS — E119 Type 2 diabetes mellitus without complications: Secondary | ICD-10-CM

## 2011-11-11 DIAGNOSIS — G459 Transient cerebral ischemic attack, unspecified: Secondary | ICD-10-CM

## 2011-11-11 DIAGNOSIS — E785 Hyperlipidemia, unspecified: Secondary | ICD-10-CM

## 2011-11-11 DIAGNOSIS — I951 Orthostatic hypotension: Secondary | ICD-10-CM

## 2011-11-11 LAB — COMPREHENSIVE METABOLIC PANEL
AST: 27 U/L (ref 0–37)
Alkaline Phosphatase: 64 U/L (ref 39–117)
BUN: 28 mg/dL — ABNORMAL HIGH (ref 6–23)
Calcium: 10.6 mg/dL — ABNORMAL HIGH (ref 8.4–10.5)
Creatinine, Ser: 3 mg/dL — ABNORMAL HIGH (ref 0.4–1.2)
Total Bilirubin: 0.7 mg/dL (ref 0.3–1.2)

## 2011-11-11 MED ORDER — ZOLPIDEM TARTRATE 5 MG PO TABS: 5.0000 mg | ORAL_TABLET | Freq: Every evening | ORAL | Status: DC | PRN

## 2011-11-11 MED ORDER — AMLODIPINE BESYLATE 10 MG PO TABS: 10.0000 mg | ORAL_TABLET | Freq: Every day | ORAL | Status: DC

## 2011-11-11 NOTE — Assessment & Plan Note (Signed)
Has continued on florinef at pre-hospital dose and is doing OK

## 2011-11-11 NOTE — Assessment & Plan Note (Signed)
Running a bit high.  Plan - continue coreg and lasix  Continue norvasc but increase dose to 10 mg daily - started at [facility]

## 2011-11-11 NOTE — Assessment & Plan Note (Signed)
B12 957  Sept 10th  Plan - next B12 injection October 20

## 2011-11-11 NOTE — Assessment & Plan Note (Signed)
Symptoms have cleared and she is at her baseline.

## 2011-11-11 NOTE — Patient Instructions (Addendum)
Please see attached med list - reviewed and it should be accurate. We are STOPPING the Halcion (Triazolam) due to it making you off balance and staggery. Will increase the amlodipine to 10 mg once a day - along with the coreg and furosemide for blood pressure control. Will check your potassium level today and then make a recommendation on the dose - for now stay with 20 meq twice a day.

## 2011-11-11 NOTE — Assessment & Plan Note (Signed)
Last lipid profile 1 month ago - LDL 76 - excellent  Plan - continue simvastatin 20 mg  - safe dose with CCB

## 2011-11-11 NOTE — Assessment & Plan Note (Signed)
Lab Results  Component Value Date   HGBA1C 5.7* 09/30/2011   Continue present regimen

## 2011-11-11 NOTE — Progress Notes (Signed)
Subjective:    Patient ID: [name], female    DOB: [birth year], 61 y.o.   MRN: [id number]  HPI Patient with recent hospitalization for TIA and behavior change in setting of multiple chroic problems. Was d/c to [facility] for ST-S[facility]. She was released Sept 24th and presents today for follow-up. She has been doing OK except BP has been elevated. Her med table was essentially unchanged except halcion was stopped - she does report that off halcion her balance and somnolence are better. She appears to be at her baseline. All her medications were carefully reviewed.  Past Medical History  Diagnosis Date  . Hypertension   . Diabetes mellitus   . Stroke   . Detached retina   . Blind right eye   . Fibroid     Fundal myoma  . Thyroid disease     Hypothyroid  . Kidney dysfunction   . Elevated cholesterol    Past Surgical History  Procedure Date  . Eye surgery   . Cesarean section   . Back surgery   . Appendectomy   . Cholecystectomy   . Carotid artery blockage   . Foot surgery    Family History  Problem Relation Age of Onset  . Hypertension Father   . Diabetes Sister   . Heart disease Sister   . Diabetes Brother   . Hypertension Brother   . Heart disease Brother   . Cancer Mother     Lung cancer   History   Social History  . Marital Status: Married    Spouse Name: N/A    Number of Children: N/A  . Years of Education: N/A   Occupational History  . Not on file.   Social History Main Topics  . Smoking status: Never Smoker   . Smokeless tobacco: Never Used  . Alcohol Use: No  . Drug Use: No  . Sexually Active: No   Other Topics Concern  . Not on file   Social History Narrative  . No narrative on file    Current Outpatient Prescriptions on File Prior to Visit  Medication Sig Dispense Refill  . aspirin 81 MG tablet Take 81 mg by mouth daily.        . B Complex-Folic Acid (B-100 BALANCED TR PO) Take 1 tablet by mouth daily.       . carvedilol (COREG) 25 MG  tablet TAKE 1 TABLET TWICE A DAY  WITH MEALS  180 tablet  1  . clopidogrel (PLAVIX) 75 MG tablet Take 1 tablet (75 mg total) by mouth daily.  90 tablet  2  . fludrocortisone (FLORINEF) 0.1 MG tablet TAKE 2 TABLETS (0.2 MG TOTAL) BY MOUTH 2 (TWO) TIMES DAILY.  120 tablet  1  . folic acid (FOLVITE) 1 MG tablet TAKE 1 TABLET BY MOUTH EVERY DAY  90 tablet  3  . furosemide (LASIX) 40 MG tablet TAKE 1 TABLET ONCE DAILY  90 tablet  3  . insulin glargine (LANTUS) 100 UNIT/ML injection Inject 20 Units into the skin daily.  10 mL  1  . levothyroxine (SYNTHROID, LEVOTHROID) 75 MCG tablet Take 1 tablet (75 mcg total) by mouth daily.  90 tablet  3  . potassium chloride SA (K-DUR,KLOR-CON) 20 MEQ tablet Take 40 mEq by mouth 2 (two) times daily.       . sertraline (ZOLOFT) 100 MG tablet Take 1 tablet (100 mg total) by mouth daily.  90 tablet  2  . simvastatin (ZOCOR) 20 MG tablet Take  1 tablet (20 mg total) by mouth at bedtime.  90 tablet  2  . ZEMPLAR 1 MCG capsule Take 1 tablet by mouth Daily.      Marland Kitchen amLODipine (NORVASC) 10 MG tablet Take 1 tablet (10 mg total) by mouth daily.  90 tablet  3  . zolpidem (AMBIEN) 5 MG tablet Take 1 tablet (5 mg total) by mouth at bedtime as needed for sleep.  90 tablet  3   Current Facility-Administered Medications on File Prior to Visit  Medication Dose Route Frequency Provider Last Rate Last Dose  . promethazine (PHENERGAN) injection 25 mg  25 mg Intramuscular Once Etta Grandchild, MD          Review of Systems Constitutional:  Negative for fever, chills, activity change and unexpected weight change.  HEENT:  Negative for hearing loss, ear pain, congestion, neck stiffness and postnasal drip. Negative for sore throat or swallowing problems. Negative for dental complaints.   Eyes: Negative for vision loss or change in visual acuity.  Respiratory: Negative for chest tightness and wheezing. Negative for DOE.   Cardiovascular: Negative for chest pain or palpitations. No  decreased exercise tolerance Gastrointestinal: No change in bowel habit. No bloating or gas. No reflux or indigestion Genitourinary: Negative for urgency, frequency, flank pain and difficulty urinating.  Musculoskeletal: Negative for myalgias, back pain, arthralgias and gait problem.  Neurological: Negative for dizziness, tremors, weakness and headaches.  Hematological: Negative for adenopathy.  Psychiatric/Behavioral: Negative for behavioral problems and dysphoric mood.       Objective:   Physical Exam Filed Vitals:   11/11/11 1557  BP: 160/84  Pulse: 63  Temp: 98.2 F (36.8 C)  Resp: 16   Wt Readings from Last 3 Encounters:  11/11/11 178 lb (80.74 kg)  09/30/11 175 lb 11.3 oz (79.7 kg)  01/07/11 175 lb (79.379 kg)   Gen'l- overweight white woman in no distress who is grappling with a complex med list HEENT- blind in right eye Cor- RRR PUlm - normal respirations Neuro- speech is clear. She does get confused by her complex med list but really does get most of it.       Assessment & Plan:

## 2011-11-11 NOTE — Assessment & Plan Note (Signed)
Sept 10th Sept 16th Creat       2.91                 2.74 K             3.2                   3.9  Plan - continue present regimen of risk reduction  See nephrologist as scheduled  Check K today - until lab returns continue on 20 meq bid.

## 2011-11-12 LAB — VITAMIN B12: Vitamin B-12: 733 pg/mL (ref 211–911)

## 2011-11-13 ENCOUNTER — Telehealth: Payer: Self-pay | Admitting: *Deleted

## 2011-11-13 NOTE — Telephone Encounter (Signed)
DAVID, OF OCCUPTAIONAL THERAPIST WITH [facility] CALLED TO FYI ON EVALUATION FOR PATIENT WHO HE WILL BE HELPING FOR 5 VISIT X 3 WEEKS. WILL FAX ORDER FOR OCCUPATIONAL PLAN OF CARE OF UPER MOBILITY TRAINING , EXERCISING FUNTIONAL TRANSFER,  WILL SEND FAX FOR ORDER.

## 2011-11-17 ENCOUNTER — Encounter: Payer: Self-pay | Admitting: Internal Medicine

## 2011-11-19 DIAGNOSIS — E119 Type 2 diabetes mellitus without complications: Secondary | ICD-10-CM

## 2011-11-19 DIAGNOSIS — I69959 Hemiplegia and hemiparesis following unspecified cerebrovascular disease affecting unspecified side: Secondary | ICD-10-CM

## 2011-11-19 DIAGNOSIS — F329 Major depressive disorder, single episode, unspecified: Secondary | ICD-10-CM

## 2011-11-19 DIAGNOSIS — I69919 Unspecified symptoms and signs involving cognitive functions following unspecified cerebrovascular disease: Secondary | ICD-10-CM

## 2011-11-21 ENCOUNTER — Telehealth: Payer: Self-pay | Admitting: *Deleted

## 2011-11-21 NOTE — Telephone Encounter (Signed)
Home health [facility]  With [facility] call concern of patient diastolic blood pressure of 90. patient saw her doctor , Dr. Bailey Mech,  Decrease the Norvasc to 5 mg. Instead of the Norvasc 10mg  you had put her on.. Nurse called concern of diastolic of 90. Please advise on norvasc dose. [facility] cb# [phone]

## 2011-11-21 NOTE — Telephone Encounter (Signed)
Diastolic of 90 does not require any adjustment in medication

## 2011-11-22 NOTE — Telephone Encounter (Signed)
Home health nurse [facility] notified of no adjustment in patient medications per Dr.Norins

## 2011-11-30 ENCOUNTER — Other Ambulatory Visit: Payer: Self-pay | Admitting: Internal Medicine

## 2011-12-10 ENCOUNTER — Telehealth: Payer: Self-pay | Admitting: *Deleted

## 2011-12-10 NOTE — Telephone Encounter (Signed)
Home health nurse with [facility], called to request extended visit with patient. Concern of set back last week of Dizziness, unsteady in her gait. Speech, OT and PT noticed a setback in her last week. Please advise

## 2011-12-10 NOTE — Telephone Encounter (Signed)
Home Health Nurse [facility], notified of approval per Dr, Debby Bud to extend home health visit with patient

## 2011-12-10 NOTE — Telephone Encounter (Signed)
Ok to extend home health

## 2011-12-12 ENCOUNTER — Other Ambulatory Visit: Payer: Self-pay | Admitting: Internal Medicine

## 2011-12-20 ENCOUNTER — Other Ambulatory Visit: Payer: Self-pay | Admitting: Internal Medicine

## 2012-01-08 ENCOUNTER — Encounter: Payer: Medicare Other | Admitting: Obstetrics and Gynecology

## 2012-01-30 ENCOUNTER — Other Ambulatory Visit: Payer: Self-pay | Admitting: Internal Medicine

## 2012-02-06 ENCOUNTER — Encounter: Payer: Self-pay | Admitting: Obstetrics and Gynecology

## 2012-02-06 ENCOUNTER — Other Ambulatory Visit: Payer: Self-pay | Admitting: Internal Medicine

## 2012-02-06 ENCOUNTER — Ambulatory Visit (INDEPENDENT_AMBULATORY_CARE_PROVIDER_SITE_OTHER): Payer: Medicare Other | Admitting: Obstetrics and Gynecology

## 2012-02-06 VITALS — BP 124/76 | Ht 64.0 in | Wt 180.0 lb

## 2012-02-06 DIAGNOSIS — N952 Postmenopausal atrophic vaginitis: Secondary | ICD-10-CM

## 2012-02-06 DIAGNOSIS — D259 Leiomyoma of uterus, unspecified: Secondary | ICD-10-CM

## 2012-02-06 DIAGNOSIS — Z780 Asymptomatic menopausal state: Secondary | ICD-10-CM

## 2012-02-06 DIAGNOSIS — H3320 Serous retinal detachment, unspecified eye: Secondary | ICD-10-CM | POA: Insufficient documentation

## 2012-02-06 DIAGNOSIS — M545 Low back pain: Secondary | ICD-10-CM

## 2012-02-06 DIAGNOSIS — D219 Benign neoplasm of connective and other soft tissue, unspecified: Secondary | ICD-10-CM

## 2012-02-06 NOTE — Progress Notes (Signed)
Patient came back to see me today for further followup. She still has some menopausal symptoms. She seems to be coping with them. She is not a good candidate for hormone replacement due to diabetes, Coronary artery disease. History of cerebrovascular accident, and impaired renal function. She is not sexually active and is unaware of vaginal dryness except with pelvic exams. She has noticed for several years right flank pain and wondered if was related to her renal function. She also has a history of lumbar disc surgery. She is having no vaginal bleeding. She is having no pelvic pain. She is overdue for mammogram. She has normal Pap smears always. Her last Pap smear was 2012.She had a normal bone density in 2011. We have been watching her with fibroids. She has done well with them. She did have an episode of postmenopausal bleeding in 2007 evaluation was normal and has not reoccurred. In 2010 pelvic ultrasound was normal except for a 3 cm fibroid. She is not having pelvic pressure or urinary frequency or rectal pressure from the fibroid.  ROS: 12 system review done. Pertinent positives above.Other positives include retinal detachment, hypertension, depression and hypothyroidism. She was very upset today because her husband has terminal lung cancer and has been very meant to her period  Physical examination:Kim Julian Reil present. HEENT within normal limits. Neck: Thyroid not large. No masses. Supraclavicular nodes: not enlarged. Breasts: Examined in both sitting and lying  position. No skin changes and no masses. Abdomen: Soft no guarding rebound or masses or hernia. Pelvic: External: Within normal limits. BUS: Within normal limits. Vaginal:within normal limits. Poor  estrogen effect. No evidence of cystocele rectocele or enterocele. Cervix: clean. Uterus: 6 week size due to fibroids. Adnexa: No masses. Rectovaginal exam: Confirmatory and negative. Extremities: Within normal limits.  Assessment: #1.  Menopausal symptoms #2. Atrophic vaginitis #3. Fibroid uterus #4. Right flank pain  Plan: Observation of above. Schedule mammogram. Discuss renal ultrasound with  nephrologist next week due to pain. Pap not done. The new Pap smear guidelines were discussed with the patient.

## 2012-02-06 NOTE — Patient Instructions (Signed)
Schedule mammogram.

## 2012-02-07 ENCOUNTER — Encounter: Payer: Self-pay | Admitting: Obstetrics and Gynecology

## 2012-02-08 ENCOUNTER — Other Ambulatory Visit: Payer: Self-pay | Admitting: Internal Medicine

## 2012-02-20 ENCOUNTER — Other Ambulatory Visit: Payer: Self-pay | Admitting: Internal Medicine

## 2012-02-20 NOTE — Telephone Encounter (Signed)
Error in charting.

## 2012-02-21 ENCOUNTER — Other Ambulatory Visit: Payer: Self-pay | Admitting: *Deleted

## 2012-02-21 ENCOUNTER — Encounter: Payer: Self-pay | Admitting: Obstetrics and Gynecology

## 2012-02-21 MED ORDER — ZOLPIDEM TARTRATE 5 MG PO TABS: 5.0000 mg | ORAL_TABLET | Freq: Every evening | ORAL | Status: DC | PRN

## 2012-03-01 ENCOUNTER — Other Ambulatory Visit: Payer: Self-pay | Admitting: Internal Medicine

## 2012-03-02 ENCOUNTER — Other Ambulatory Visit: Payer: Self-pay | Admitting: *Deleted

## 2012-03-02 MED ORDER — SERTRALINE HCL 100 MG PO TABS: 100.0000 mg | ORAL_TABLET | Freq: Every day | ORAL | Status: DC

## 2012-03-12 ENCOUNTER — Other Ambulatory Visit: Payer: Self-pay | Admitting: Internal Medicine

## 2012-03-16 ENCOUNTER — Telehealth: Payer: Self-pay | Admitting: *Deleted

## 2012-03-16 NOTE — Telephone Encounter (Signed)
[facility] called requesting clarification on pt's rx refill for cyanocobalamin, B-12, 1000mg  inj. Please advise to how often pt needs inj.

## 2012-03-16 NOTE — Telephone Encounter (Signed)
Monthly injections. 

## 2012-03-16 NOTE — Telephone Encounter (Signed)
Called pharmacy and told them once monthly.

## 2012-03-18 ENCOUNTER — Other Ambulatory Visit: Payer: Self-pay | Admitting: Internal Medicine

## 2012-04-09 ENCOUNTER — Ambulatory Visit (INDEPENDENT_AMBULATORY_CARE_PROVIDER_SITE_OTHER): Payer: Medicare Other | Admitting: Internal Medicine

## 2012-04-09 ENCOUNTER — Encounter: Payer: Self-pay | Admitting: Internal Medicine

## 2012-04-09 VITALS — BP 144/70 | HR 64 | Temp 97.6°F | Resp 10 | Wt 172.0 lb

## 2012-04-09 DIAGNOSIS — F4321 Adjustment disorder with depressed mood: Secondary | ICD-10-CM

## 2012-04-09 NOTE — Patient Instructions (Addendum)
Grief and loss counseling - I think this will help. Hospice is going to call you tomorrow to help work out some assistance.  You need help in planning for your future: finances, where you will live etc. I have asked the Hospice staff to help identify resources that you can access to help you. This can supplement Carmen's efforts to solve this complicated situation.  I strongly recommend that you live in an Assisted Living setting: so you have people around you, a dining facility, house keeping and assistance, including transportation.  On the list I gave you I have personal knowledge of Lanelle Bal and her associates.  You need to come back to see me soon for general medical care: diabetes check up, Blood pressure management, lab work etc.

## 2012-04-12 ENCOUNTER — Encounter: Payer: Self-pay | Admitting: Internal Medicine

## 2012-04-12 NOTE — Progress Notes (Signed)
Subjective:    Patient ID: [name], female    DOB: 12-Oct-1950, 62 y.o.   MRN: [id number]  HPI [name] presents to discuss her current social situation. Her husband, Alonza Bogus, recently died from lung cancer. He had been her primary care-taker Sales executive of their affairs. She has significant limitations due to h/o CVA with cognitive impairment. She is at a loss as to how to manage her affairs. Her only child, [name], has been trying to help her.  Primary issues include the need for daily care, finances, medication administration, transportation. In addition, she is deeply grieving. She feels bad that she wasn't aware of the fact that her husband was dying until just before he actually died.   Past Medical History  Diagnosis Date  . Diabetes mellitus   . Stroke   . Detached retina   . Blind right eye   . Kidney dysfunction   . Elevated cholesterol   . Fibroid     Fundal myoma  . Thyroid disease     Hypothyroid  . Hypertension    Past Surgical History  Procedure Laterality Date  . Eye surgery    . Cesarean section    . Back surgery    . Appendectomy    . Cholecystectomy    . Carotid artery blockage    . Foot surgery     Family History  Problem Relation Age of Onset  . Hypertension Father   . Diabetes Sister   . Heart disease Sister   . Diabetes Brother   . Hypertension Brother   . Heart disease Brother   . Cancer Mother     Lung cancer  . Breast cancer Mother     Age 13  . Diabetes Paternal Uncle    History   Social History  . Marital Status: Widowed    Spouse Name: Alonza Bogus    Number of Children: 1  . Years of Education: 16   Occupational History  . Systems analyst     reitred on disability   Social History Main Topics  . Smoking status: Former Games developer  . Smokeless tobacco: Never Used  . Alcohol Use: No  . Drug Use: No  . Sexually Active: No   Other Topics Concern  . Not on file   Social History Narrative   [employer], [employer] [place]  -'[address]. married '74 widowed '14. 1 daughter -'86. work: Systems analyst until disabled. Unstable home situation since death of her husband Feb 26, 2022.   Current Outpatient Prescriptions on File Prior to Visit  Medication Sig Dispense Refill  . amLODipine (NORVASC) 10 MG tablet Take 1 tablet (10 mg total) by mouth daily.  90 tablet  3  . aspirin 81 MG tablet Take 81 mg by mouth daily.        . B Complex-Folic Acid (B-100 BALANCED TR PO) Take 1 tablet by mouth daily.       . carvedilol (COREG) 25 MG tablet TAKE 1 TABLET TWICE A DAY  WITH MEALS  180 tablet  1  . clopidogrel (PLAVIX) 75 MG tablet TAKE 1 TABLET DAILY  90 tablet  1  . fludrocortisone (FLORINEF) 0.1 MG tablet TAKE 2 TABLETS (0.2 MG TOTAL) BY MOUTH 2 (TWO) TIMES DAILY.  120 tablet  3  . folic acid (FOLVITE) 1 MG tablet TAKE 1 TABLET BY MOUTH EVERY DAY  90 tablet  3  . furosemide (LASIX) 40 MG tablet TAKE 1 TABLET ONCE DAILY  90  tablet  3  . LANTUS 100 UNIT/ML injection INJECT 70 UNITS INTO THE   SKIN AT BEDTIME  70 mL  3  . potassium chloride SA (K-DUR,KLOR-CON) 20 MEQ tablet Take 40 mEq by mouth 2 (two) times daily.       . sertraline (ZOLOFT) 100 MG tablet Take 1 tablet (100 mg total) by mouth daily.  90 tablet  2  . simvastatin (ZOCOR) 20 MG tablet TAKE 1 TABLET AT BEDTIME  90 tablet  2  . SYNTHROID 75 MCG tablet TAKE 1 TABLET DAILY  90 tablet  3  . ZEMPLAR 1 MCG capsule Take 1 tablet by mouth Daily.      Marland Kitchen zolpidem (AMBIEN) 5 MG tablet Take 1 tablet (5 mg total) by mouth at bedtime as needed for sleep.  90 tablet  3  . cyanocobalamin (,VITAMIN B-12,) 1000 MCG/ML injection INJECT 1 ML (1,000 MCG TOTAL) INTO THE MUSCLE ONCE.  30 mL  0   Current Facility-Administered Medications on File Prior to Visit  Medication Dose Route Frequency Provider Last Rate Last Dose  . promethazine (PHENERGAN) injection 25 mg  25 mg Intramuscular Once Etta Grandchild, MD           Review of Systems System review is negative for any  constitutional, cardiac, pulmonary, GI or neuro symptoms or complaints other than as described in the HPI.     Objective:   Physical Exam  Filed Vitals:   04/09/12 1540  BP: 144/70  Pulse: 64  Temp: 97.6 F (36.4 C)  Resp: 10   General - older white woman who is emotionally fragile and in grief. Neuro - able to grasp her situation but not able to manage her financial affairs or make appropriate plans.      Assessment & Plan:  Grief and loss - recent death of her husband which is still hard for her to deal with plus uncertainty about how to proceed, i.e. Living arrangement and managing finances and ADLs.  Plan Spoke with Mindi Junker at [facility] - requesting they reach out with grief counseling and also social work support to help direct [name] to resources necessary for managing her future plans.   Provided names of several attorneys/firms that do Elder Care  Will need to reach out to [facility]

## 2012-04-14 ENCOUNTER — Other Ambulatory Visit: Payer: Self-pay | Admitting: Internal Medicine

## 2012-04-23 ENCOUNTER — Other Ambulatory Visit (INDEPENDENT_AMBULATORY_CARE_PROVIDER_SITE_OTHER): Payer: Medicare Other

## 2012-04-23 ENCOUNTER — Ambulatory Visit (INDEPENDENT_AMBULATORY_CARE_PROVIDER_SITE_OTHER): Payer: Medicare Other | Admitting: Internal Medicine

## 2012-04-23 ENCOUNTER — Encounter: Payer: Self-pay | Admitting: Internal Medicine

## 2012-04-23 VITALS — BP 124/56 | HR 70 | Temp 97.8°F | Resp 8 | Wt 172.0 lb

## 2012-04-23 DIAGNOSIS — E119 Type 2 diabetes mellitus without complications: Secondary | ICD-10-CM

## 2012-04-23 DIAGNOSIS — E785 Hyperlipidemia, unspecified: Secondary | ICD-10-CM

## 2012-04-23 DIAGNOSIS — I10 Essential (primary) hypertension: Secondary | ICD-10-CM

## 2012-04-23 DIAGNOSIS — E079 Disorder of thyroid, unspecified: Secondary | ICD-10-CM

## 2012-04-23 DIAGNOSIS — E538 Deficiency of other specified B group vitamins: Secondary | ICD-10-CM

## 2012-04-23 DIAGNOSIS — N184 Chronic kidney disease, stage 4 (severe): Secondary | ICD-10-CM

## 2012-04-23 DIAGNOSIS — F329 Major depressive disorder, single episode, unspecified: Secondary | ICD-10-CM

## 2012-04-23 LAB — COMPREHENSIVE METABOLIC PANEL
Albumin: 3.8 g/dL (ref 3.5–5.2)
Alkaline Phosphatase: 78 U/L (ref 39–117)
BUN: 20 mg/dL (ref 6–23)
Glucose, Bld: 197 mg/dL — ABNORMAL HIGH (ref 70–99)
Potassium: 3.6 mEq/L (ref 3.5–5.1)

## 2012-04-23 LAB — TSH: TSH: 3.19 u[IU]/mL (ref 0.35–5.50)

## 2012-04-23 LAB — LIPID PANEL
Cholesterol: 178 mg/dL (ref 0–200)
HDL: 64.8 mg/dL (ref >39.00)
VLDL: 17.6 mg/dL (ref 0.0–40.0)

## 2012-04-23 LAB — HEMOGLOBIN A1C: Hgb A1c MFr Bld: 5.9 % (ref 4.6–6.5)

## 2012-04-23 NOTE — Patient Instructions (Addendum)
1. Living solutions - we need to think about long term solutions. This will involve the advice of an attorney and financial advisor to be able to manage your estate and issues. .  2. Diabetes - will check lab today with recommendations to follow  3. High blood pressure - good control today. Plan - routine follow up lab.  Continue your current medications  4. Cholesterol - will check your labs today with recommendations to follow.

## 2012-04-26 NOTE — Assessment & Plan Note (Signed)
Lab reveals very good control with LDL better than goal of 100 or less.  Plan Continue present medication

## 2012-04-26 NOTE — Assessment & Plan Note (Signed)
A1C reflects excellent control.  Plan  Continue present regimen

## 2012-04-26 NOTE — Assessment & Plan Note (Signed)
Lab Results  Component Value Date   TSH 3.19 04/23/2012   Plan Continue present replacement dose of levothyroxine.

## 2012-04-26 NOTE — Progress Notes (Signed)
Subjective:    Patient ID: [name], female    DOB: 1950-07-29, 62 y.o.   MRN: [id number]  HPI [name] returns today for follow up of diabetes, HTN, lipids management and social solutions in regard to living situations. She is accompanied by her daughter [name]. In the interval since her last visit she has had informal grief counseling from a contact at church. A hospice grief consult had been ordered but [name] cancelled that appointment in favor of the above. She has not had any financial or legal counseling in regard to a viable long term solution to her living needs.  She has continued to take her medications. Her medication table is complex and her daughter has been helping set up her weekly medication tray. She has no report of hypoglycemic events or other medication related problems.  PMH, FamHx and SocHx reviewed for any changes and relevance.   Current Outpatient Prescriptions on File Prior to Visit  Medication Sig Dispense Refill  . amLODipine (NORVASC) 10 MG tablet Take 1 tablet (10 mg total) by mouth daily.  90 tablet  3  . aspirin 81 MG tablet Take 81 mg by mouth daily.        . B Complex-Folic Acid (B-100 BALANCED TR PO) Take 1 tablet by mouth daily.       . carvedilol (COREG) 25 MG tablet TAKE 1 TABLET TWICE A DAY  WITH MEALS  180 tablet  1  . clopidogrel (PLAVIX) 75 MG tablet TAKE 1 TABLET DAILY  90 tablet  1  . cyanocobalamin (,VITAMIN B-12,) 1000 MCG/ML injection INJECT 1 ML (1,000 MCG TOTAL) INTO THE MUSCLE ONCE.  30 mL  0  . fludrocortisone (FLORINEF) 0.1 MG tablet TAKE 2 TABLETS (0.2 MG TOTAL) BY MOUTH 2 (TWO) TIMES DAILY.  120 tablet  3  . folic acid (FOLVITE) 1 MG tablet TAKE 1 TABLET BY MOUTH EVERY DAY  90 tablet  3  . furosemide (LASIX) 40 MG tablet TAKE 1 TABLET ONCE DAILY  90 tablet  3  . LANTUS 100 UNIT/ML injection INJECT 70 UNITS INTO THE   SKIN AT BEDTIME  70 mL  3  . potassium chloride SA (K-DUR,KLOR-CON) 20 MEQ tablet Take 40 mEq by  mouth 2 (two) times daily.       . sertraline (ZOLOFT) 100 MG tablet Take 1 tablet (100 mg total) by mouth daily.  90 tablet  2  . simvastatin (ZOCOR) 20 MG tablet TAKE 1 TABLET AT BEDTIME  90 tablet  2  . SYNTHROID 75 MCG tablet TAKE 1 TABLET DAILY  90 tablet  3  . ZEMPLAR 1 MCG capsule Take 1 tablet by mouth Daily.      Marland Kitchen zolpidem (AMBIEN) 5 MG tablet Take 1 tablet (5 mg total) by mouth at bedtime as needed for sleep.  90 tablet  3   Current Facility-Administered Medications on File Prior to Visit  Medication Dose Route Frequency Provider Last Rate Last Dose  . promethazine (PHENERGAN) injection 25 mg  25 mg Intramuscular Once Etta Grandchild, MD          Review of Systems System review is negative for any constitutional, cardiac, pulmonary, GI or neuro symptoms or complaints other than as described in the HPI.     Objective:   Physical Exam Filed Vitals:   04/23/12 1510  BP: 124/56  Pulse: 70  Temp: 97.8 F (36.6 C)  Resp: 8   Wt Readings from Last 3 Encounters:  04/23/12  172 lb (78.019 kg)  04/09/12 172 lb (78.019 kg)  02/06/12 180 lb (81.647 kg)   Gen'l- an overweight white woman in no distress but still sad and overwhelmed with her social situation HEENT_ C&S clear, she has a drooping eyelid right. Cor- 2+ radial, RRR Pulm - normal respirations Neuro - awake, alert. Cognition is fair. Ambulates w/o assistance.  Lab Results  Component Value Date   WBC 7.3 09/30/2011   HGB 10.8* 09/30/2011   HCT 31.3* 09/30/2011   PLT 140* 09/30/2011   GLUCOSE 197* 04/23/2012   CHOL 178 04/23/2012   TRIG 88.0 04/23/2012   HDL 64.80 04/23/2012   LDLCALC 96 04/23/2012   ALT 16 04/23/2012   AST 21 04/23/2012   NA 143 04/23/2012   K 3.6 04/23/2012   CL 109 04/23/2012   CREATININE 3.1* 04/23/2012   BUN 20 04/23/2012   CO2 25 04/23/2012   TSH 3.19 04/23/2012   INR 1.16 09/30/2011   HGBA1C 5.9 04/23/2012   MICROALBUR 4.5* 04/27/2008          Assessment & Plan:  Social Issues - Mrs.  Lucero and [name] are encouraged to seek counseling and advice from a Artist and possibly and attorney to assess her resources and develop a plan for the long term. Currently the cost of AL exceed her ability to pay and her income disqualifies her from [place] and other avenus of assistance.

## 2012-04-26 NOTE — Assessment & Plan Note (Signed)
B12 level at 768 is in normal range.

## 2012-04-26 NOTE — Assessment & Plan Note (Signed)
BP Readings from Last 3 Encounters:  04/23/12 124/56  04/09/12 144/70  02/06/12 124/76   Very good control on her present medications.

## 2012-04-26 NOTE — Assessment & Plan Note (Signed)
Appropriate grieving and depression exacerbated by her limitations and her worry over a long-term living situation.  PLan  Continue sertaline 100 mg daily  Grief counseling either with hospice or [facility]

## 2012-04-26 NOTE — Assessment & Plan Note (Signed)
Creatinine stable at 3.1, GFR 16. She is followed by [facility].

## 2012-04-27 ENCOUNTER — Encounter: Payer: Self-pay | Admitting: Internal Medicine

## 2012-05-12 ENCOUNTER — Telehealth: Payer: Self-pay | Admitting: *Deleted

## 2012-05-12 MED ORDER — FLUDROCORTISONE ACETATE 0.1 MG PO TABS: 0.2000 mg | ORAL_TABLET | Freq: Two times a day (BID) | ORAL | Status: DC

## 2012-05-12 NOTE — Telephone Encounter (Signed)
Pt called to see if form she mailed into Dr. Debby Bud was received by office-pt requesting callback.

## 2012-05-12 NOTE — Telephone Encounter (Signed)
Do you recall a letter on this pt??

## 2012-05-15 NOTE — Telephone Encounter (Signed)
I have found no paperwork for Mrs. Selvy. Please call her to learn more about what we should be getting.  Thanks

## 2012-05-18 NOTE — Telephone Encounter (Signed)
LVM for pt to return call to get information regarding paperwork

## 2012-06-08 ENCOUNTER — Telehealth: Payer: Self-pay | Admitting: Internal Medicine

## 2012-06-08 DIAGNOSIS — F329 Major depressive disorder, single episode, unspecified: Secondary | ICD-10-CM

## 2012-06-08 MED ORDER — TRAZODONE HCL 50 MG PO TABS: 25.0000 mg | ORAL_TABLET | Freq: Every evening | ORAL | Status: DC | PRN

## 2012-06-08 NOTE — Telephone Encounter (Signed)
Try trazodone, rx was sent to pharmacy

## 2012-06-08 NOTE — Telephone Encounter (Signed)
Daughter calling for her mother, states that she things that the Ambien is causing her mother to have hallucinations.  Is there something else that she can take?

## 2012-06-09 NOTE — Telephone Encounter (Signed)
Pt.notified

## 2012-07-09 ENCOUNTER — Telehealth: Payer: Self-pay

## 2012-07-09 NOTE — Telephone Encounter (Signed)
Patient returned call and left message.  I called her back, no answer, voicemail did not come on. Will try again later.

## 2012-07-09 NOTE — Telephone Encounter (Signed)
Phone call to patient asking her to return the call. Paperwork from [facility] pharmacy was received regarding a back brace. Dr Debby Bud wants to confirm that she really is requesting this before this is filled out and faxed back.

## 2012-07-10 NOTE — Telephone Encounter (Signed)
I talked to patient and she states she remembers getting a call from [employer] about the back brace but she hung up on them. This is not something she is interested in. I let her know I will discard the paperwork.

## 2012-07-27 ENCOUNTER — Other Ambulatory Visit: Payer: Self-pay | Admitting: Internal Medicine

## 2012-08-11 ENCOUNTER — Ambulatory Visit (INDEPENDENT_AMBULATORY_CARE_PROVIDER_SITE_OTHER): Payer: Medicare Other | Admitting: Internal Medicine

## 2012-08-11 ENCOUNTER — Encounter: Payer: Self-pay | Admitting: Internal Medicine

## 2012-08-11 VITALS — BP 130/60 | HR 63 | Temp 97.0°F | Wt 169.8 lb

## 2012-08-11 DIAGNOSIS — E119 Type 2 diabetes mellitus without complications: Secondary | ICD-10-CM

## 2012-08-11 DIAGNOSIS — F329 Major depressive disorder, single episode, unspecified: Secondary | ICD-10-CM

## 2012-08-11 NOTE — Patient Instructions (Addendum)
1. Diabetes - based on body weight and last A1C 22 units of lantus may be enough PLan  check blood sugar every morning before breakfast - goal is 150 or less on a regular basis  2. Make a check list to look at every morning - check your pill box every day.  3. You definitely need to move to Assisted Living - in a facility near [place] so she can look in on you. YOu will need spend down your capital but you have enough to move, have help getting the house ready for sale and sell it. The remained proceeds will go towards your assisted living costs. Only when you have spent down your resources will you be eligible for Medicaid.   4. You have been diagnosed with gastroparesis - poor movement of the stomach.  Plan  multiple small feedings will minimize the nausea and vomiting.   Gastroparesis  Gastroparesis is also called slowed stomach emptying (delayed gastric emptying). It is a condition in which the stomach takes too long to empty its contents. It often happens in people with diabetes.  CAUSES  Gastroparesis happens when nerves to the stomach are damaged or stop working. When the nerves are damaged, the muscles of the stomach and intestines do not work normally. The movement of food is slowed or stopped. High blood glucose (sugar) causes changes in nerves and can damage the blood vessels that carry oxygen and nutrients to the nerves. RISK FACTORS  Diabetes.  Post-viral syndromes.  Eating disorders (anorexia, bulimia).  Surgery on the stomach or vagus nerve.  Gastroesophageal reflux disease (rarely).  Smooth muscle disorders (amyloidosis, scleroderma).  Metabolic disorders, including hypothyroidism.  Parkinson's disease. SYMPTOMS   Heartburn.  Feeling sick to your stomach (nausea).  Vomiting of undigested food.  An early feeling of fullness when eating.  Weight loss.  Abdominal bloating.  Erratic blood glucose levels.  Lack of appetite.  Gastroesophageal  reflux.  Spasms of the stomach wall. Complications can include:  Bacterial overgrowth in stomach. Food stays in the stomach and can ferment and cause bacteria to grow.  Weight loss due to difficulty digesting and absorbing nutrients.  Vomiting.  Obstruction in the stomach. Undigested food can harden and cause nausea and vomiting.  Blood glucose fluctuations caused by inconsistent food absorption. DIAGNOSIS  The diagnosis of gastroparesis is confirmed through one or more of the following tests:  Barium X-rays and scans. These tests look at how long it takes for food to move through the stomach.  Gastric manometry. This test measures electrical and muscular activity in the stomach. A thin tube is passed down the throat into the stomach. The tube contains a wire that takes measurements of the stomach's electrical and muscular activity as it digests liquids and solid food.  Endoscopy. This procedure is done with a long, thin tube called an endoscope. It is passed through the mouth and gently guides down the esophagus into the stomach. This tube helps the caregiver look at the lining of the stomach to check for any abnormalities.  Ultrasound. This can rule out gallbladder disease or pancreatitis. This test will outline and define the shape of the gallbladder and pancreas. TREATMENT   The primary treatment is to identify the problem and help control blood glucose levels. Treatments include:  Exercise.  Medicines to control nausea and vomiting.  Medicines to stimulate stomach muscles.  Changes in what and when you eat.  Having smaller meals more often.  Eating low-fiber forms of high-fiber foods, such  aseating cooked vegetables instead of raw vegetables.  Eating low-fat foods.  Consuming liquids, which are easier to digest.  In severe cases, feeding tubes and intravenous (IV) feeding may be needed. It is important to note that in most cases, treatment does not cure  gastroparesis. It is usually a lasting (chronic) condition. Treatment helps you manage the condition so that you can be as healthy and comfortable as possible. NEW TREATMENTS  A gastric neurostimulator has been developed to assist people with gastroparesis. The battery-operated device is surgically implanted. It emits mild electrical pulses to help improve stomach emptying and to control nausea and vomiting.  The use of botulinum toxin has been shown to improve stomach emptying by decreasing the prolonged contractions of the muscle between the stomach and the small intestine (pyloric sphincter). The benefits are temporary. SEEK MEDICAL CARE IF:   You are having problems keeping your blood glucose in goal range.  You are having nausea, vomiting, bloating, or early feelings of fullness with eating.  Your symptoms do not change with a change in diet. Document Released: 01/28/2005 Document Revised: 04/22/2011 Document Reviewed: 07/07/2008 [facility] Patient Information 2014 [place], [place].

## 2012-08-11 NOTE — Progress Notes (Signed)
  Subjective:    Patient ID: [name], female    DOB: [birth year], 62 y.o.   MRN: [id number]  HPI [name] presents for follow up. She is 5 months a widow. She continues to live alone and recognizes that she needs   Review of Systems     Objective:   Physical Exam        Assessment & Plan:

## 2012-08-12 NOTE — Assessment & Plan Note (Signed)
Chroni  Depression was exacerbated by the death of her husband, and primary care giver, in January '14. She is doing better. She presents today to discuss planning.   She admits that she needs to be in AL for safety and social reasons. She does have cash resources that can carry her for 1-2 years and she owns a home  Plan  strongly advised she locate a AL facility near her daughter and make the move.  Advised that she invest some of her limited capital in preparing her home for sale - it will maximize her selling price that will be worth the investment  At some point, when she has spent down her resources she will be able to apply for medicaid.  She had a very supportive life-long friend with her today who seems to helping her.  (greater than 50% of 40 min visit spent on education and counseling)

## 2012-08-12 NOTE — Assessment & Plan Note (Signed)
Last A1C 5.8%!! She does report that she has reduced her lantus to 22 units. Based on dosing at 0.3-0.5 units per kilogram this is close to right.  PLan Continue lantus 22 unis daily  Preload syringes for ease of medication preparation given her poor vision  Monitor fasting CBGs for several days and report back.

## 2012-08-17 ENCOUNTER — Other Ambulatory Visit: Payer: Self-pay

## 2012-08-17 MED ORDER — ZOLPIDEM TARTRATE 5 MG PO TABS: 5.0000 mg | ORAL_TABLET | Freq: Every evening | ORAL | Status: DC | PRN

## 2012-08-17 NOTE — Telephone Encounter (Signed)
Phone call from patient stating she needs a refill on her Zolpidem. Script needs to [facility]. Please advise. Thanks.

## 2012-08-18 NOTE — Telephone Encounter (Signed)
Waiting for signature on script

## 2012-08-18 NOTE — Telephone Encounter (Signed)
Script has been [facility]ed to [facility] [phone]

## 2012-09-15 ENCOUNTER — Other Ambulatory Visit: Payer: Self-pay | Admitting: Internal Medicine

## 2012-09-17 ENCOUNTER — Ambulatory Visit (INDEPENDENT_AMBULATORY_CARE_PROVIDER_SITE_OTHER): Payer: Medicare Other | Admitting: Internal Medicine

## 2012-09-17 ENCOUNTER — Encounter: Payer: Self-pay | Admitting: Internal Medicine

## 2012-09-17 ENCOUNTER — Other Ambulatory Visit (INDEPENDENT_AMBULATORY_CARE_PROVIDER_SITE_OTHER): Payer: Medicare Other

## 2012-09-17 VITALS — BP 108/50 | HR 58 | Temp 97.7°F | Wt 165.8 lb

## 2012-09-17 DIAGNOSIS — F3289 Other specified depressive episodes: Secondary | ICD-10-CM

## 2012-09-17 DIAGNOSIS — E538 Deficiency of other specified B group vitamins: Secondary | ICD-10-CM

## 2012-09-17 DIAGNOSIS — E785 Hyperlipidemia, unspecified: Secondary | ICD-10-CM

## 2012-09-17 DIAGNOSIS — F329 Major depressive disorder, single episode, unspecified: Secondary | ICD-10-CM

## 2012-09-17 DIAGNOSIS — E119 Type 2 diabetes mellitus without complications: Secondary | ICD-10-CM

## 2012-09-17 LAB — HEPATIC FUNCTION PANEL
ALT: 22 U/L (ref 0–35)
AST: 26 U/L (ref 0–37)
Alkaline Phosphatase: 78 U/L (ref 39–117)
Bilirubin, Direct: 0.1 mg/dL (ref 0.0–0.3)
Total Bilirubin: 0.8 mg/dL (ref 0.3–1.2)

## 2012-09-17 LAB — COMPREHENSIVE METABOLIC PANEL
Alkaline Phosphatase: 78 U/L (ref 39–117)
BUN: 27 mg/dL — ABNORMAL HIGH (ref 6–23)
Creatinine, Ser: 3.2 mg/dL — ABNORMAL HIGH (ref 0.4–1.2)
Glucose, Bld: 195 mg/dL — ABNORMAL HIGH (ref 70–99)
Sodium: 142 mEq/L (ref 135–145)
Total Bilirubin: 0.8 mg/dL (ref 0.3–1.2)

## 2012-09-17 LAB — LIPID PANEL
Cholesterol: 151 mg/dL (ref 0–200)
HDL: 51.3 mg/dL (ref >39.00)
LDL Cholesterol: 78 mg/dL (ref 0–99)
Triglycerides: 109 mg/dL (ref 0.0–149.0)
VLDL: 21.8 mg/dL (ref 0.0–40.0)

## 2012-09-17 NOTE — Patient Instructions (Addendum)
Thanks for seeing me and speaking with Imbary today.    Please continue to see Santina Evans, the hospice counselor.  I would like you to continue to find an assisted living community near [place] that you would like to move into.  It would be good for you to live in a community that offers you at least one meal per day and one that would offer additional social contact.  They can also assist with transportation.  To be able to move, a realtor may help you prepare your house and sell it, though you will likely have to invest some of your money to prepare your home to move.

## 2012-09-17 NOTE — Progress Notes (Signed)
Subjective:     Patient ID: [name], female   DOB: December 31, 1950, 62 y.o.   MRN: [id number]  HPI Comments: [name] is a 62 YO lady with a hx of depression and strokes who presents today stating that she feels "bad" lately and "hasn't been feeling like doing anything."  She has had difficulty getting out of bed and performing activities of daily living, including eating.  She reports that she has felt depressed for a long time but that it has gotten worse since her husband died seven months ago.  She has limited social contact since she lives alone and cannot drive.  She is also worried about the future and knows that she needs to move into an assisted living community for assisted care.  However, she is concerned that she may only be able to live in such a community for 2-3 years due to financial constrains.  She is most worried about the prospect of having to go on Medicaid and have to share a bedroom with a stranger in such a community.  She also states that her balance is "off" for the last four to five months and says that she feels dizzy and lightheaded.  She does not think this is related to her visual disturbances (R eye blindness, L eye blurriness for the past few weeks)  She denies hearing loss or tinnitus and any numbness or tingling.    Review of Systems  Constitutional: Positive for activity change and fatigue.  HENT: Negative for neck pain, neck stiffness and dental problem.   Eyes: Positive for visual disturbance. Negative for pain and itching.  Respiratory: Negative for cough, choking, chest tightness and shortness of breath.   Cardiovascular: Negative for chest pain and palpitations.  Neurological: Positive for dizziness, facial asymmetry and light-headedness. Negative for seizures, speech difficulty, weakness, numbness and headaches.   Past Medical History  Diagnosis Date   Diabetes mellitus    Stroke    Detached retina    Blind right eye    Kidney dysfunction     Elevated cholesterol    Fibroid     Fundal myoma   Thyroid disease     Hypothyroid   Hypertension    Past Surgical History  Procedure Laterality Date   Eye surgery     Cesarean section     Back surgery     Appendectomy     Cholecystectomy     Carotid artery blockage     Foot surgery     Family History  Problem Relation Age of Onset   Hypertension Father    Diabetes Sister    Heart disease Sister    Diabetes Brother    Hypertension Brother    Heart disease Brother    Cancer Mother     Lung cancer   Breast cancer Mother     Age 15   Diabetes Paternal Uncle    History   Social History   Marital Status: Widowed    Spouse Name: Alonza Bogus    Number of Children: 1   Years of Education: 16   Occupational History   Systems analyst     reitred on disability   Social History Main Topics   Smoking status: Former Smoker   Smokeless tobacco: Never Used   Alcohol Use: No   Drug Use: No   Sexually Active: No   Other Topics Concern   Not on file   Social History Narrative   HS[employer], [employer] [place] -'70  Social research officer, government. married '74 widowed '14. 1 daughter -'87. work: Systems analyst until disabled. Unstable home situation since death of her husband 03-13-22.       Objective:   Physical Exam  Vitals reviewed. Constitutional: She appears well-developed and well-nourished.  HENT:  Head: Normocephalic and atraumatic.  Right Ear: External ear normal.  Left Ear: External ear normal.  Eyes: Right eye exhibits no discharge. Left eye exhibits no discharge. Right eye exhibits abnormal extraocular motion.  Cardiovascular: Normal rate, regular rhythm, normal heart sounds and intact distal pulses.  Exam reveals no gallop and no friction rub.   No murmur heard. Pulmonary/Chest: Effort normal and breath sounds normal. No respiratory distress. She has no wheezes. She has no rales. She exhibits no tenderness.       Assessment/Plan:     1.  Depressed mood - This seems to me to be an abnormal grief reaction superimposed on depression. Social work counseling could probably connect her with community resources.   2. Long term disposition - issue is complicated by her multiple medical problems and her cognitive impairment.    I would like you to continue to find an assisted living community near [place] that you would like to move into.  It would be good for you to live in a community that offers you at least one meal per day and one that would offer additional social contact.  They can also assist with transportation.  To be able to move, a realtor may help you prepare your house and sell it, though you will likely have to invest some of your money to prepare your home to move.

## 2012-09-18 ENCOUNTER — Encounter: Payer: Self-pay | Admitting: Internal Medicine

## 2012-09-18 NOTE — Assessment & Plan Note (Signed)
Appropriately grieving and depressed. She reports that she is continuing to receive grief counseling through [facility].

## 2012-09-18 NOTE — Assessment & Plan Note (Signed)
Lab Results  Component Value Date   HGBA1C 6.2 09/17/2012   Good control - no change in regimen

## 2012-09-21 ENCOUNTER — Other Ambulatory Visit: Payer: Self-pay | Admitting: Internal Medicine

## 2012-09-22 ENCOUNTER — Other Ambulatory Visit: Payer: Self-pay | Admitting: *Deleted

## 2012-09-22 MED ORDER — LEVOTHYROXINE SODIUM 75 MCG PO TABS: 75.0000 ug | ORAL_TABLET | Freq: Every day | ORAL | Status: DC

## 2012-10-27 ENCOUNTER — Other Ambulatory Visit: Payer: Self-pay | Admitting: Internal Medicine

## 2012-11-23 ENCOUNTER — Other Ambulatory Visit: Payer: Self-pay | Admitting: Internal Medicine

## 2012-11-23 MED ORDER — SERTRALINE HCL 100 MG PO TABS: 100.0000 mg | ORAL_TABLET | Freq: Every day | ORAL | Status: DC

## 2012-11-23 NOTE — Telephone Encounter (Signed)
[facility] [address] [facility] [place] request refill for Sertraline 100 mg for Mrs. Everheart. Fulton Mole also stated that Mrs. Island is no longer with mail order company. Please advise.

## 2013-01-05 ENCOUNTER — Other Ambulatory Visit: Payer: Self-pay | Admitting: Internal Medicine

## 2013-01-05 ENCOUNTER — Encounter: Payer: Self-pay | Admitting: Internal Medicine

## 2013-01-05 DIAGNOSIS — Z1239 Encounter for other screening for malignant neoplasm of breast: Secondary | ICD-10-CM

## 2013-01-19 ENCOUNTER — Other Ambulatory Visit (INDEPENDENT_AMBULATORY_CARE_PROVIDER_SITE_OTHER): Payer: Medicare HMO

## 2013-01-19 ENCOUNTER — Encounter: Payer: Self-pay | Admitting: Internal Medicine

## 2013-01-19 ENCOUNTER — Ambulatory Visit (INDEPENDENT_AMBULATORY_CARE_PROVIDER_SITE_OTHER): Payer: Medicare HMO | Admitting: Internal Medicine

## 2013-01-19 VITALS — BP 130/70 | HR 59 | Temp 97.5°F | Wt 177.8 lb

## 2013-01-19 DIAGNOSIS — E785 Hyperlipidemia, unspecified: Secondary | ICD-10-CM

## 2013-01-19 DIAGNOSIS — E119 Type 2 diabetes mellitus without complications: Secondary | ICD-10-CM

## 2013-01-19 DIAGNOSIS — N184 Chronic kidney disease, stage 4 (severe): Secondary | ICD-10-CM

## 2013-01-19 DIAGNOSIS — F329 Major depressive disorder, single episode, unspecified: Secondary | ICD-10-CM

## 2013-01-19 DIAGNOSIS — E538 Deficiency of other specified B group vitamins: Secondary | ICD-10-CM

## 2013-01-19 DIAGNOSIS — I10 Essential (primary) hypertension: Secondary | ICD-10-CM

## 2013-01-19 DIAGNOSIS — Z23 Encounter for immunization: Secondary | ICD-10-CM

## 2013-01-19 DIAGNOSIS — E079 Disorder of thyroid, unspecified: Secondary | ICD-10-CM

## 2013-01-19 LAB — COMPREHENSIVE METABOLIC PANEL
AST: 20 U/L (ref 0–37)
Albumin: 4.1 g/dL (ref 3.5–5.2)
Alkaline Phosphatase: 82 U/L (ref 39–117)
BUN: 26 mg/dL — ABNORMAL HIGH (ref 6–23)
CO2: 23 mEq/L (ref 19–32)
Creatinine, Ser: 2.8 mg/dL — ABNORMAL HIGH (ref 0.4–1.2)
GFR: 18.41 mL/min — ABNORMAL LOW (ref >60.00)
Glucose, Bld: 167 mg/dL — ABNORMAL HIGH (ref 70–99)
Potassium: 3.7 mEq/L (ref 3.5–5.1)

## 2013-01-19 LAB — HEPATIC FUNCTION PANEL
Albumin: 4.1 g/dL (ref 3.5–5.2)
Alkaline Phosphatase: 82 U/L (ref 39–117)
Total Bilirubin: 0.6 mg/dL (ref 0.3–1.2)
Total Protein: 7.5 g/dL (ref 6.0–8.3)

## 2013-01-19 LAB — LIPID PANEL
HDL: 54.3 mg/dL (ref >39.00)
LDL Cholesterol: 91 mg/dL (ref 0–99)
Total CHOL/HDL Ratio: 3
Triglycerides: 130 mg/dL (ref 0.0–149.0)
VLDL: 26 mg/dL (ref 0.0–40.0)

## 2013-01-19 MED ORDER — SIMVASTATIN 20 MG PO TABS: ORAL_TABLET | ORAL | Status: DC

## 2013-01-19 MED ORDER — PARICALCITOL 1 MCG PO CAPS: 1.0000 ug | ORAL_CAPSULE | Freq: Every day | ORAL | Status: AC

## 2013-01-19 MED ORDER — FLUDROCORTISONE ACETATE 0.1 MG PO TABS: 0.2000 mg | ORAL_TABLET | Freq: Two times a day (BID) | ORAL | Status: DC

## 2013-01-19 MED ORDER — SERTRALINE HCL 100 MG PO TABS: 100.0000 mg | ORAL_TABLET | Freq: Every day | ORAL | Status: DC

## 2013-01-19 MED ORDER — FUROSEMIDE 40 MG PO TABS: ORAL_TABLET | ORAL | Status: DC

## 2013-01-19 MED ORDER — LEVOTHYROXINE SODIUM 75 MCG PO TABS: 75.0000 ug | ORAL_TABLET | Freq: Every day | ORAL | Status: DC

## 2013-01-19 MED ORDER — FOLIC ACID 1 MG PO TABS: ORAL_TABLET | ORAL | Status: AC

## 2013-01-19 MED ORDER — POTASSIUM CHLORIDE CRYS ER 20 MEQ PO TBCR: 40.0000 meq | EXTENDED_RELEASE_TABLET | Freq: Two times a day (BID) | ORAL | Status: DC

## 2013-01-19 MED ORDER — CARVEDILOL 25 MG PO TABS: ORAL_TABLET | ORAL | Status: DC

## 2013-01-19 MED ORDER — AMLODIPINE BESYLATE 10 MG PO TABS: 10.0000 mg | ORAL_TABLET | Freq: Every day | ORAL | Status: DC

## 2013-01-19 MED ORDER — TRAZODONE HCL 50 MG PO TABS: 50.0000 mg | ORAL_TABLET | Freq: Every evening | ORAL | Status: DC | PRN

## 2013-01-19 MED ORDER — FOLIC ACID 1 MG PO TABS: ORAL_TABLET | ORAL | Status: DC

## 2013-01-19 MED ORDER — CLOPIDOGREL BISULFATE 75 MG PO TABS: ORAL_TABLET | ORAL | Status: DC

## 2013-01-19 NOTE — Progress Notes (Signed)
Subjective:    Patient ID: [name], female    DOB: 04-28-1950, 62 y.o.   MRN: [id number]  HPI Mrs. Goelz presents for follow up and review of medications. Since her last visit she has moved to the [place] [place], congregate living. She continues to have social problems: her daughter is Retail banker estate; she does feel "bossed" and has some conflict with [name]; she has missed 3 mortgage payments and the bank has sent foreclosure notice and the house is no on the market; she is making an adjustment to her living situation made worse by her lack of mobility/transportation. With all this her spirits are OK.  Past Medical History  Diagnosis Date  . Diabetes mellitus   . Stroke   . Detached retina   . Blind right eye   . Kidney dysfunction   . Elevated cholesterol   . Fibroid     Fundal myoma  . Thyroid disease     Hypothyroid  . Hypertension    Past Surgical History  Procedure Laterality Date  . Eye surgery    . Cesarean section    . Back surgery    . Appendectomy    . Cholecystectomy    . Carotid artery blockage    . Foot surgery     Family History  Problem Relation Age of Onset  . Hypertension Father   . Diabetes Sister   . Heart disease Sister   . Diabetes Brother   . Hypertension Brother   . Heart disease Brother   . Cancer Mother     Lung cancer  . Breast cancer Mother     Age 82  . Diabetes Paternal Uncle    History   Social History  . Marital Status: Widowed    Spouse Name: Alonza Bogus    Number of Children: 1  . Years of Education: 16   Occupational History  . Systems analyst     reitred on disability   Social History Main Topics  . Smoking status: Former Games developer  . Smokeless tobacco: Never Used  . Alcohol Use: No  . Drug Use: No  . Sexual Activity: No   Other Topics Concern  . Not on file   Social History Narrative   HS[place], [employer] [place] -'[address]. married '74 widowed '14. 1 daughter -'33. work: Systems analyst  until disabled. Unstable home situation since death of her husband 03/26/22.    Current Outpatient Prescriptions on File Prior to Visit  Medication Sig Dispense Refill  . aspirin 81 MG tablet Take 81 mg by mouth daily.        . B Complex-Folic Acid (B-100 BALANCED TR PO) Take 1 tablet by mouth daily.       Marland Kitchen zolpidem (AMBIEN) 5 MG tablet Take 1 tablet (5 mg total) by mouth at bedtime as needed for sleep.  90 tablet  3   Current Facility-Administered Medications on File Prior to Visit  Medication Dose Route Frequency Provider Last Rate Last Dose  . promethazine (PHENERGAN) injection 25 mg  25 mg Intramuscular Once Etta Grandchild, MD          Review of Systems  Constitutional: Negative.   HENT: Negative.   Eyes: Positive for visual disturbance.  Respiratory: Negative.   Cardiovascular: Negative.   Gastrointestinal: Negative.   Endocrine: Negative.   Genitourinary: Negative.   Musculoskeletal: Negative.   Skin: Negative.   Allergic/Immunologic: Negative.   Neurological: Positive for dizziness.  Hematological:  Negative.   Psychiatric/Behavioral: Positive for confusion and decreased concentration. The patient is nervous/anxious.        Objective:   Physical Exam Filed Vitals:   01/19/13 1043  BP: 130/70  Pulse: 59  Temp: 97.5 F (36.4 C)   Wt Readings from Last 3 Encounters:  01/19/13 177 lb 12.8 oz (80.65 kg)  09/17/12 165 lb 12.8 oz (75.206 kg)  08/11/12 169 lb 12.8 oz (77.021 kg)   Gen'l - overweight woman in no distress HEENT - OD - droopy lid, poor vision; OS normal Cor- 2+ radial pulse Pulm  Normal respirations Neuro - awake, speech clear, seems alert, does seem to grasp complex issues, e.g. Financial situation, need to work with the bank and prepare her house for sale. Ambulates w/o assistance.       Assessment & Plan:  (greater than 50% of 45 min visit spent on education, polypharmacy review  and counseling)

## 2013-01-19 NOTE — Patient Instructions (Signed)
Good to see you. Although not a perfect situation I am pleased you are in a congregate living situation.  You need to call the bank and work with them on giving you some more time to get your house on the market. I can have Sterling Davina Howlett contact you as a Veterinary surgeon, he is with [facility].   Please Call back to the office so we can be sure we have a correct cell phone number for you.  Lab work today and I will send you a report.  Please keep the attached medication list - I believe it to be accurate. Printed prescriptions can be taken to your pharmacy of choice.   Do you best to have a happy holiday season.

## 2013-01-19 NOTE — Progress Notes (Signed)
Pre visit review using our clinic review tool, if applicable. No additional management support is needed unless otherwise documented below in the visit note. 

## 2013-01-20 ENCOUNTER — Encounter: Payer: Self-pay | Admitting: Internal Medicine

## 2013-01-20 ENCOUNTER — Telehealth: Payer: Self-pay

## 2013-01-20 DIAGNOSIS — E079 Disorder of thyroid, unspecified: Secondary | ICD-10-CM

## 2013-01-20 MED ORDER — LEVOTHYROXINE SODIUM 88 MCG PO TABS: 88.0000 ug | ORAL_TABLET | Freq: Every day | ORAL | Status: DC

## 2013-01-20 NOTE — Telephone Encounter (Signed)
[name] called back and states she does not know [place]

## 2013-01-20 NOTE — Telephone Encounter (Signed)
Message copied by Noreene Larsson on Wed Jan 20, 2013  8:32 AM ------      Message from: Illene Regulus E      Created: Wed Jan 20, 2013  4:47 AM       Please call in new Rx for levothyroxine 88 mcg to patient's pharmacy. I  Am not sure we have the correct pharmacy on record since she has moved, please check with her.            THANKS ------

## 2013-01-20 NOTE — Assessment & Plan Note (Signed)
BP Readings from Last 3 Encounters:  01/19/13 130/70  09/17/12 108/50  08/11/12 130/60   Good control on present medications - see updated med list.

## 2013-01-20 NOTE — Assessment & Plan Note (Signed)
Lab Results  Component Value Date   TSH 10.08* 01/19/2013   Mild elevation - will send in new Rx for 88 mcg at next refill

## 2013-01-20 NOTE — Assessment & Plan Note (Signed)
She has not been coming in for B12 injections. Lab Results  Component Value Date   VITAMINB12 856 01/19/2013   Normal B12 level.   Plan otc B12 supplement

## 2013-01-20 NOTE — Telephone Encounter (Signed)
Try calling her daughter carmen [phone] to get vickie's current cell phone number. I doubt she still goes to the [facility].  thanks

## 2013-01-20 NOTE — Telephone Encounter (Signed)
Do not have correct contact info for patient. She did call yesterday with a new number but I wrote it down wrong so hopefully she calls back. [facility]

## 2013-01-20 NOTE — Assessment & Plan Note (Signed)
Last ov w/ Dr. Caryn Section at [facility] May '13.   Plan She is to call [facility] to make a follow up appointment with a new doctor since Dr. Caryn Section has retired.

## 2013-01-20 NOTE — Telephone Encounter (Signed)
I called patient's daughter and left a message asking her to have patient call us back again with her new contact info.

## 2013-01-24 ENCOUNTER — Other Ambulatory Visit: Payer: Self-pay | Admitting: Internal Medicine

## 2013-01-27 ENCOUNTER — Telehealth: Payer: Self-pay | Admitting: *Deleted

## 2013-01-27 NOTE — Telephone Encounter (Signed)
Pt called requesting a return call in reference to her lab work/thyroid medication increase.  Left mess for pt to return call

## 2013-02-13 ENCOUNTER — Other Ambulatory Visit: Payer: Self-pay | Admitting: Internal Medicine

## 2013-02-18 IMAGING — CR DG CHEST 2V
2 series · 2 of 2 positions shown · non-contrast
Comparison: 09/29/2009

CLINICAL DATA: Shortness of breath.  Confusion.  Altered mental
status.

CHEST - 2 VIEW

[w chest pa]
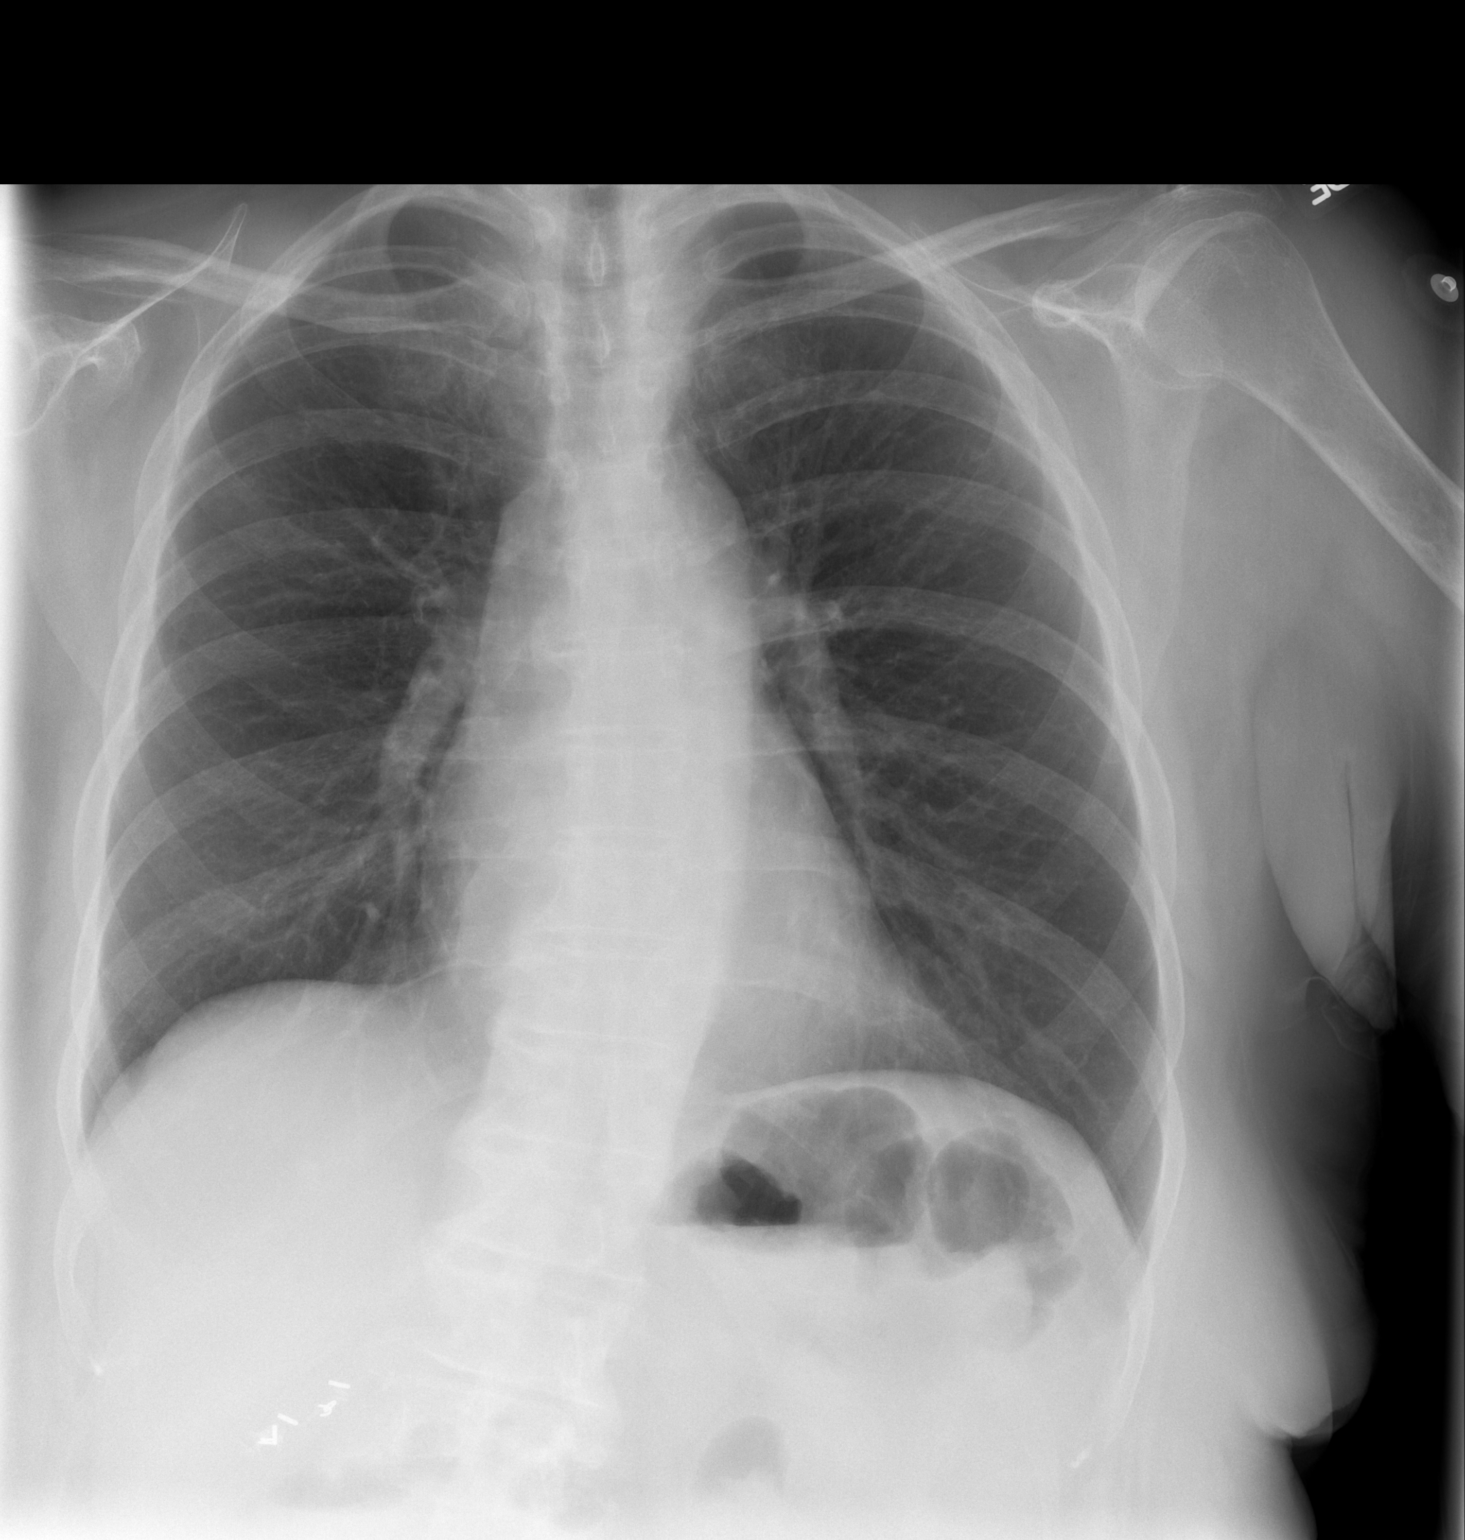

[w chest lat]
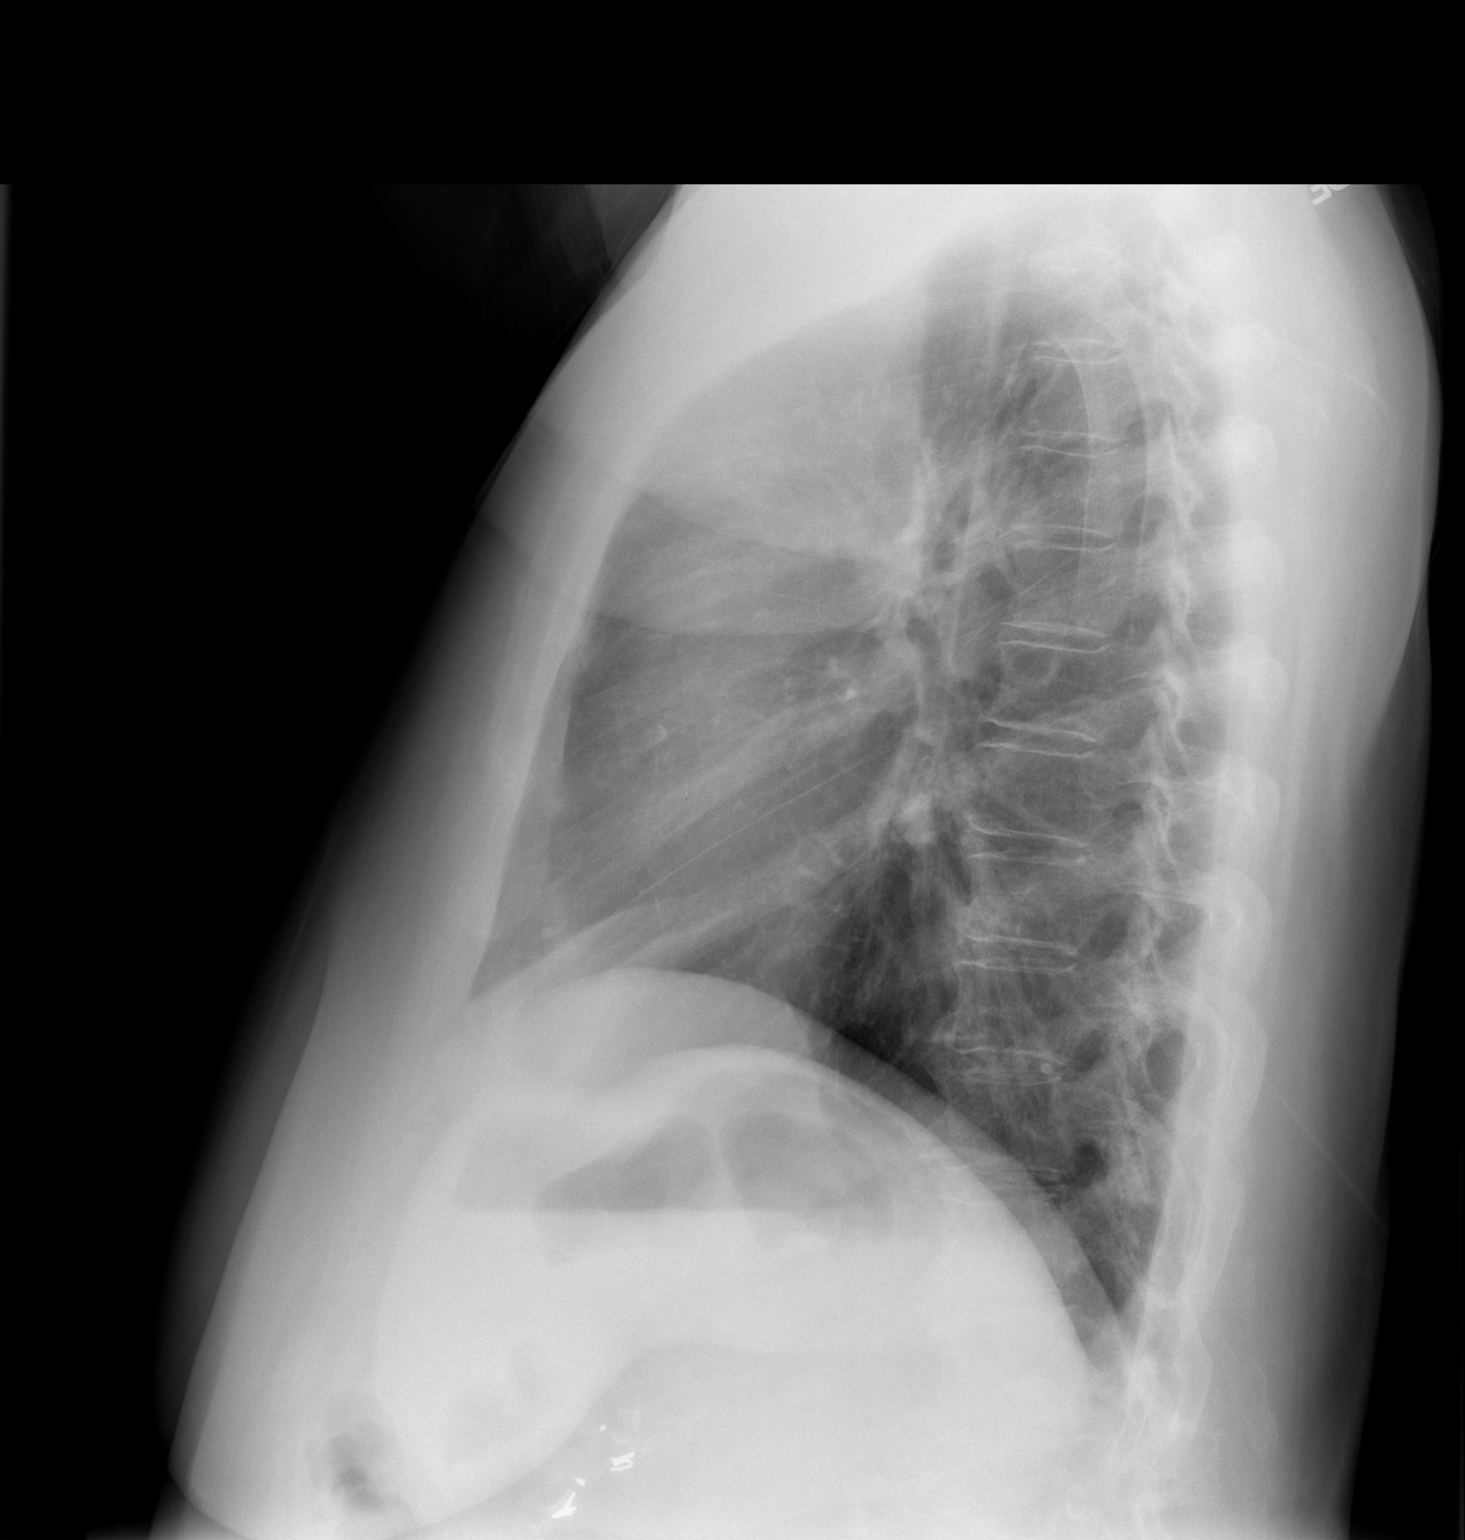

[2 of 2 positions shown; findings below may reference images not displayed]

FINDINGS: The heart size and mediastinal contours are within
normal limits.  Both lungs are clear.  The visualized skeletal
structures are unremarkable.
IMPRESSION: No active cardiopulmonary disease.

## 2013-03-07 ENCOUNTER — Other Ambulatory Visit: Payer: Self-pay | Admitting: Internal Medicine

## 2013-04-27 ENCOUNTER — Other Ambulatory Visit: Payer: Self-pay | Admitting: Internal Medicine

## 2013-06-07 ENCOUNTER — Telehealth: Payer: Self-pay

## 2013-06-07 NOTE — Telephone Encounter (Signed)
Refill request from the pharmacy for fludrocortisone 0.1 mg tablet 2 by mouth 2 times a day #360. I tried calling the patient at the above number and she was no longer a resident at this location, she moved out in jan 2015. I sent a message to the pharmacy advising " this is a new patient, who will require an OV before we can fill med's. Please have the patient call the office."   To MD to make her aware. Her New Est apt in June.     KP

## 2013-06-07 NOTE — Telephone Encounter (Signed)
agree

## 2013-06-11 ENCOUNTER — Telehealth: Payer: Self-pay | Admitting: Family Medicine

## 2013-06-11 MED ORDER — ZOLPIDEM TARTRATE 5 MG PO TABS: ORAL_TABLET | ORAL | Status: DC

## 2013-06-11 NOTE — Telephone Encounter (Signed)
Last seen 01/19/13 and filled 04/28/13 #90.  This patient has not been seen here Please advise      KP

## 2013-06-11 NOTE — Telephone Encounter (Signed)
Refill x1 

## 2013-06-11 NOTE — Telephone Encounter (Signed)
Caller name:Kianni Relation to pt: self Call back number: [phone] [id number] [place], [address]   Reason for call: pt is needing a refill on RX zolpidem (AMBIEN) 5 MG tablet .  Pt was seeing Dr. Linda Hedges, but has a new pt apt on 07/12/13 with Dr. Etter Sjogren.  Please advise.

## 2013-06-11 NOTE — Telephone Encounter (Signed)
Rx faxed.    KP 

## 2013-06-23 ENCOUNTER — Other Ambulatory Visit: Payer: Self-pay

## 2013-06-23 MED ORDER — FLUDROCORTISONE ACETATE 0.1 MG PO TABS: 0.2000 mg | ORAL_TABLET | Freq: Two times a day (BID) | ORAL | Status: DC

## 2013-07-12 ENCOUNTER — Ambulatory Visit (INDEPENDENT_AMBULATORY_CARE_PROVIDER_SITE_OTHER): Payer: Commercial Managed Care - HMO | Admitting: Family Medicine

## 2013-07-12 ENCOUNTER — Telehealth: Payer: Self-pay | Admitting: Family Medicine

## 2013-07-12 ENCOUNTER — Telehealth: Payer: Self-pay

## 2013-07-12 ENCOUNTER — Encounter: Payer: Self-pay | Admitting: Family Medicine

## 2013-07-12 VITALS — BP 124/76 | HR 68 | Temp 98.0°F | Ht 64.0 in | Wt 168.4 lb

## 2013-07-12 DIAGNOSIS — G4700 Insomnia, unspecified: Secondary | ICD-10-CM

## 2013-07-12 DIAGNOSIS — E785 Hyperlipidemia, unspecified: Secondary | ICD-10-CM

## 2013-07-12 DIAGNOSIS — R319 Hematuria, unspecified: Secondary | ICD-10-CM

## 2013-07-12 DIAGNOSIS — I10 Essential (primary) hypertension: Secondary | ICD-10-CM

## 2013-07-12 DIAGNOSIS — E1159 Type 2 diabetes mellitus with other circulatory complications: Secondary | ICD-10-CM

## 2013-07-12 DIAGNOSIS — Z0000 Encounter for general adult medical examination without abnormal findings: Secondary | ICD-10-CM

## 2013-07-12 MED ORDER — FLUDROCORTISONE ACETATE 0.1 MG PO TABS: 0.2000 mg | ORAL_TABLET | Freq: Two times a day (BID) | ORAL | Status: AC

## 2013-07-12 MED ORDER — ZOLPIDEM TARTRATE 5 MG PO TABS: ORAL_TABLET | ORAL | Status: DC

## 2013-07-12 NOTE — Telephone Encounter (Signed)
Relevant patient education mailed to patient.  

## 2013-07-12 NOTE — Progress Notes (Signed)
Pre visit review using our clinic review tool, if applicable. No additional management support is needed unless otherwise documented below in the visit note. 

## 2013-07-12 NOTE — Telephone Encounter (Signed)
Invalid number.  New Patient

## 2013-07-12 NOTE — Patient Instructions (Addendum)
Hypertension As your heart beats, it forces blood through your arteries. This force is your blood pressure. If the pressure is too high, it is called hypertension (HTN) or high blood pressure. HTN is dangerous because you may have it and not know it. High blood pressure may mean that your heart has to work harder to pump blood. Your arteries may be narrow or stiff. The extra work puts you at risk for heart disease, stroke, and other problems.  Blood pressure consists of two numbers, a higher number over a lower, 110/72, for example. It is stated as "110 over 72." The ideal is below 120 for the top number (systolic) and under 80 for the bottom (diastolic). Write down your blood pressure today. You should pay close attention to your blood pressure if you have certain conditions such as:  Heart failure.  Prior heart attack.  Diabetes  Chronic kidney disease.  Prior stroke.  Multiple risk factors for heart disease. To see if you have HTN, your blood pressure should be measured while you are seated with your arm held at the level of the heart. It should be measured at least twice. A one-time elevated blood pressure reading (especially in the Emergency Department) does not mean that you need treatment. There may be conditions in which the blood pressure is different between your right and left arms. It is important to see your caregiver soon for a recheck. Most people have essential hypertension which means that there is not a specific cause. This type of high blood pressure may be lowered by changing lifestyle factors such as:  Stress.  Smoking.  Lack of exercise.  Excessive weight.  Drug/tobacco/alcohol use.  Eating less salt. Most people do not have symptoms from high blood pressure until it has caused damage to the body. Effective treatment can often prevent, delay or reduce that damage. TREATMENT  When a cause has been identified, treatment for high blood pressure is directed at the  cause. There are a large number of medications to treat HTN. These fall into several categories, and your caregiver will help you select the medicines that are best for you. Medications may have side effects. You should review side effects with your caregiver. If your blood pressure stays high after you have made lifestyle changes or started on medicines,   Your medication(s) may need to be changed.  Other problems may need to be addressed.  Be certain you understand your prescriptions, and know how and when to take your medicine.  Be sure to follow up with your caregiver within the time frame advised (usually within two weeks) to have your blood pressure rechecked and to review your medications.  If you are taking more than one medicine to lower your blood pressure, make sure you know how and at what times they should be taken. Taking two medicines at the same time can result in blood pressure that is too low. SEEK IMMEDIATE MEDICAL CARE IF:  You develop a severe headache, blurred or changing vision, or confusion.  You have unusual weakness or numbness, or a faint feeling.  You have severe chest or abdominal pain, vomiting, or breathing problems. MAKE SURE YOU:   Understand these instructions.  Will watch your condition.  Will get help right away if you are not doing well or get worse. Document Released: 01/28/2005 Document Revised: 04/22/2011 Document Reviewed: 09/18/2007 [facility] Patient Information 2014 [place]. Diabetes and Standards of Medical Care  Diabetes is complicated. You may find that your diabetes  team includes a dietitian, nurse, diabetes educator, eye doctor, and more. To help everyone know what is going on and to help you get the care you deserve, the following schedule of care was developed to help keep you on track. Below are the tests, exams, vaccines, medicines, education, and plans you will need. HbA1c test This test shows how well you have controlled your  glucose over the past 2 3 months. It is used to see if your diabetes management plan needs to be adjusted.   It is performed at least 2 times a year if you are meeting treatment goals.  It is performed 4 times a year if therapy has changed or if you are not meeting treatment goals. Blood pressure test  This test is performed at every routine medical visit. The goal is less than 140/90 mmHg for most people, but 130/80 mmHg in some cases. Ask your health care provider about your goal. Dental exam  Follow up with the dentist regularly. Eye exam  If you are diagnosed with type 1 diabetes as a child, get an exam upon reaching the age of 58 years or older and have had diabetes for 3 5 years. Yearly eye exams are recommended after that initial eye exam.  If you are diagnosed with type 1 diabetes as an adult, get an exam within 5 years of diagnosis and then yearly.  If you are diagnosed with type 2 diabetes, get an exam as soon as possible after the diagnosis and then yearly. Foot care exam  Visual foot exams are performed at every routine medical visit. The exams check for cuts, injuries, or other problems with the feet.  A comprehensive foot exam should be done yearly. This includes visual inspection as well as assessing foot pulses and testing for loss of sensation.  Check your feet nightly for cuts, injuries, or other problems with your feet. Tell your health care provider if anything is not healing. Kidney function test (urine microalbumin)  This test is performed once a year.  Type 1 diabetes: The first test is performed 5 years after diagnosis.  Type 2 diabetes: The first test is performed at the time of diagnosis.  A serum creatinine and estimated glomerular filtration rate (eGFR) test is done once a year to assess the level of chronic kidney disease (CKD), if present. Lipid profile (cholesterol, HDL, LDL, triglycerides)  Performed every 5 years for most people.  The goal for  LDL is less than 100 mg/dL. If you are at high risk, the goal is less than 70 mg/dL.  The goal for HDL is 40 mg/dL 50 mg/dL for men and 50 mg/dL 60 mg/dL for women. An HDL cholesterol of 60 mg/dL or higher gives some protection against heart disease.  The goal for triglycerides is less than 150 mg/dL. Influenza vaccine, pneumococcal vaccine, and hepatitis B vaccine  The influenza vaccine is recommended yearly.  The pneumococcal vaccine is generally given once in a lifetime. However, there are some instances when another vaccination is recommended. Check with your health care provider.  The hepatitis B vaccine is also recommended for adults with diabetes. Diabetes self-management education  Education is recommended at diagnosis and ongoing as needed. Treatment plan  Your treatment plan is reviewed at every medical visit. Document Released: 11/25/2008 Document Revised: 09/30/2012 Document Reviewed: 06/30/2012 [facility] Patient Information 2014 [place].

## 2013-07-12 NOTE — Progress Notes (Signed)
Subjective:    Patient ID: [name], female    DOB: Feb 20, 1950, 63 y.o.   MRN: [id number]  HPI  HPI HYPERTENSION  Blood pressure range-good  Chest pain- no      Dyspnea- no Lightheadedness- no   Edema- no Other side effects - no   Medication compliance: good Low salt diet- yes  DIABETES  Blood Sugar ranges-not checking  Polyuria- no New Visual problems- no Hypoglycemic symptoms- no Other side effects-no Medication compliance - stopped insulin on her own Last eye exam- 05/2013 Foot exam- today  HYPERLIPIDEMIA  Medication compliance- good RUQ pain- no  Muscle aches- no Other side effects-no  Review of Systems  Constitutional: Negative for diaphoresis, appetite change, fatigue and unexpected weight change.  Eyes: Negative for pain, redness and visual disturbance.  Respiratory: Negative for cough, chest tightness, shortness of breath and wheezing.   Cardiovascular: Negative for chest pain, palpitations and leg swelling.  Endocrine: Negative for cold intolerance, heat intolerance, polydipsia, polyphagia and polyuria.  Genitourinary: Negative for dysuria, frequency and difficulty urinating.  Neurological: Negative for dizziness, light-headedness, numbness and headaches.   Past Medical History  Diagnosis Date  . Diabetes mellitus   . Stroke   . Detached retina   . Blind right eye   . Kidney dysfunction   . Elevated cholesterol   . Fibroid     Fundal myoma  . Thyroid disease     Hypothyroid  . Hypertension    History   Social History  . Marital Status: Widowed    Spouse Name: Donata Clay    Number of Children: 1  . Years of Education: 16   Occupational History  . Designer, multimedia     reitred on disability   Social History Main Topics  . Smoking status: Former Research scientist (life sciences)  . Smokeless tobacco: Never Used  . Alcohol Use: No  . Drug Use: No  . Sexual Activity: No   Other Topics Concern  . Not on file   Social History Narrative   HSG, [place].  [place] -'[address]. married '74 widowed '14. 1 daughter -'79. work: Designer, multimedia until disabled. Unstable home situation since death of her husband March 19, 2022.   Family History  Problem Relation Age of Onset  . Hypertension Father   . Diabetes Sister   . Heart disease Sister   . Diabetes Brother   . Hypertension Brother   . Heart disease Brother   . Cancer Mother     Lung cancer  . Breast cancer Mother     Age 64  . Diabetes Paternal Uncle    Current Outpatient Prescriptions  Medication Sig Dispense Refill  . amLODipine (NORVASC) 10 MG tablet TAKE 1 TABLET EVERY DAY  90 tablet  3  . aspirin 81 MG tablet Take 81 mg by mouth daily.        . carvedilol (COREG) 25 MG tablet TAKE 1 TABLET TWICE A DAY  WITH MEALS  60 tablet  11  . clopidogrel (PLAVIX) 75 MG tablet TAKE 1 TABLET BY MOUTH EVERY DAY  90 tablet  3  . fludrocortisone (FLORINEF) 0.1 MG tablet Take 2 tablets (0.2 mg total) by mouth 2 (two) times daily.  614 tablet  0  . folic acid (FOLVITE) 1 MG tablet TAKE 1 TABLET BY MOUTH EVERY DAY  30 tablet  11  . levothyroxine (SYNTHROID, LEVOTHROID) 88 MCG tablet Take 1 tablet (88 mcg total) by mouth daily.  30 tablet  11  .  paricalcitol (ZEMPLAR) 1 MCG capsule Take 1 capsule (1 mcg total) by mouth daily.  30 capsule  11  . sertraline (ZOLOFT) 100 MG tablet TAKE 1 TABLET (100 MG TOTAL) BY MOUTH DAILY.  90 tablet  0  . simvastatin (ZOCOR) 20 MG tablet TAKE 1 TABLET BY MOUTH IN THE EVENING  90 tablet  3  . zolpidem (AMBIEN) 5 MG tablet TAKE 1 TAB AT BEDTIME AS NEEDED FOR SLEEP  90 tablet  0   No current facility-administered medications for this visit.       Objective:   Physical Exam  Nursing note and vitals reviewed. Constitutional: She is oriented to person, place, and time. She appears well-developed and well-nourished.  HENT:  Head: Normocephalic and atraumatic.  Eyes:  Blind R eye  Neck: Normal range of motion. Neck supple. No JVD present. Carotid bruit is not  present. No thyromegaly present.  Cardiovascular: Normal rate, regular rhythm and normal heart sounds.   No murmur heard. Pulmonary/Chest: Effort normal and breath sounds normal. No respiratory distress. She has no wheezes. She has no rales. She exhibits no tenderness.  Musculoskeletal: She exhibits no edema.  Neurological: She is alert and oriented to person, place, and time.  Psychiatric: She has a normal mood and affect.  Sensory exam of the foot is normal, tested with the monofilament. Good pulses, no lesions or ulcers, good peripheral pulses.         Assessment & Plan:  1. Other and unspecified hyperlipidemia Cont meds, check labs - Hepatic function panel - Lipid panel  2. Type II or unspecified type diabetes mellitus with peripheral circulatory disorders, uncontrolled(250.72) Check labs - Hemoglobin A1c  3. HTN (hypertension) Stable, con't meds - fludrocortisone (FLORINEF) 0.1 MG tablet; Take 2 tablets (0.2 mg total) by mouth 2 (two) times daily.  Dispense: 360 tablet; Refill: 0 - Basic metabolic panel - CBC with Differential - Microalbumin / creatinine urine ratio - POCT urinalysis dipstick - TSH  4. Insomnia  - zolpidem (AMBIEN) 5 MG tablet; TAKE 1 TAB AT BEDTIME AS NEEDED FOR SLEEP  Dispense: 90 tablet; Refill: 0  5. Preventative health care rto 6 months - Ambulatory referral to Gastroenterology

## 2013-07-13 LAB — BASIC METABOLIC PANEL
BUN: 35 mg/dL — AB (ref 6–23)
CO2: 22 meq/L (ref 19–32)
Calcium: 10.4 mg/dL (ref 8.4–10.5)
Chloride: 107 mEq/L (ref 96–112)
Creatinine, Ser: 3.1 mg/dL — ABNORMAL HIGH (ref 0.4–1.2)
GFR: 16.13 mL/min — ABNORMAL LOW (ref >60.00)
Glucose, Bld: 168 mg/dL — ABNORMAL HIGH (ref 70–99)
POTASSIUM: 3.3 meq/L — AB (ref 3.5–5.1)
Sodium: 138 mEq/L (ref 135–145)

## 2013-07-13 LAB — CBC WITH DIFFERENTIAL/PLATELET
Basophils Absolute: 0 10*3/uL (ref 0.0–0.1)
Basophils Relative: 0.5 % (ref 0.0–3.0)
EOS ABS: 0.2 10*3/uL (ref 0.0–0.7)
EOS PCT: 2.6 % (ref 0.0–5.0)
HEMATOCRIT: 35.5 % — AB (ref 36.0–46.0)
Hemoglobin: 12.1 g/dL (ref 12.0–15.0)
LYMPHS ABS: 1.9 10*3/uL (ref 0.7–4.0)
Lymphocytes Relative: 20.4 % (ref 12.0–46.0)
MCHC: 33.9 g/dL (ref 30.0–36.0)
MCV: 96.6 fl (ref 78.0–100.0)
MONO ABS: 0.5 10*3/uL (ref 0.1–1.0)
Monocytes Relative: 5.3 % (ref 3.0–12.0)
Neutro Abs: 6.6 10*3/uL (ref 1.4–7.7)
Neutrophils Relative %: 71.2 % (ref 43.0–77.0)
PLATELETS: 180 10*3/uL (ref 150.0–400.0)
RBC: 3.68 Mil/uL — ABNORMAL LOW (ref 3.87–5.11)
RDW: 12.9 % (ref 11.5–15.5)
WBC: 9.2 10*3/uL (ref 4.0–10.5)

## 2013-07-13 LAB — POCT URINALYSIS DIPSTICK
Bilirubin, UA: NEGATIVE
Ketones, UA: NEGATIVE
Leukocytes, UA: NEGATIVE
Nitrite, UA: NEGATIVE
PROTEIN UA: 100
SPEC GRAV UA: 1.02
UROBILINOGEN UA: 0.2
pH, UA: 5

## 2013-07-13 LAB — HEPATIC FUNCTION PANEL
ALT: 15 U/L (ref 0–35)
AST: 21 U/L (ref 0–37)
Albumin: 4 g/dL (ref 3.5–5.2)
Alkaline Phosphatase: 85 U/L (ref 39–117)
BILIRUBIN DIRECT: 0.1 mg/dL (ref 0.0–0.3)
Total Bilirubin: 0.6 mg/dL (ref 0.2–1.2)
Total Protein: 7.6 g/dL (ref 6.0–8.3)

## 2013-07-13 LAB — LIPID PANEL
CHOLESTEROL: 191 mg/dL (ref 0–200)
HDL: 53.2 mg/dL (ref >39.00)
LDL Cholesterol: 115 mg/dL — ABNORMAL HIGH (ref 0–99)
TRIGLYCERIDES: 116 mg/dL (ref 0.0–149.0)
Total CHOL/HDL Ratio: 4
VLDL: 23.2 mg/dL (ref 0.0–40.0)

## 2013-07-13 LAB — HEMOGLOBIN A1C: HEMOGLOBIN A1C: 8.2 % — AB (ref 4.6–6.5)

## 2013-07-13 LAB — TSH: TSH: 1.85 u[IU]/mL (ref 0.35–4.50)

## 2013-07-13 LAB — MICROALBUMIN / CREATININE URINE RATIO
Creatinine,U: 137.4 mg/dL
Microalb Creat Ratio: 12 mg/g (ref 0.0–30.0)
Microalb, Ur: 16.5 mg/dL — ABNORMAL HIGH (ref 0.0–1.9)

## 2013-07-13 NOTE — Telephone Encounter (Signed)
Unable to reach patient pre visit.  

## 2013-07-13 NOTE — Addendum Note (Signed)
Addended by: Harl Bowie on: 07/13/2013 04:51 PM   Modules accepted: Orders

## 2013-07-14 MED ORDER — METFORMIN HCL ER 500 MG PO TB24: 500.0000 mg | ORAL_TABLET | Freq: Every day | ORAL | Status: DC

## 2013-07-15 LAB — URINE CULTURE

## 2013-07-16 ENCOUNTER — Telehealth: Payer: Self-pay | Admitting: Family Medicine

## 2013-07-16 NOTE — Telephone Encounter (Signed)
Patient called and stated that her pharmacy still does not have her rx Ambien. Please advise

## 2013-07-16 NOTE — Telephone Encounter (Signed)
I called the pharmacy and the Ambien script was there and they said they will get it ready for the patient, I tried calling the patient to make her aware but her VM is full. Unable to leave a message    KP

## 2013-07-29 DIAGNOSIS — N184 Chronic kidney disease, stage 4 (severe): Secondary | ICD-10-CM | POA: Insufficient documentation

## 2013-07-29 DIAGNOSIS — I10 Essential (primary) hypertension: Secondary | ICD-10-CM | POA: Insufficient documentation

## 2013-08-11 ENCOUNTER — Telehealth: Payer: Self-pay | Admitting: Family Medicine

## 2013-08-11 DIAGNOSIS — G4700 Insomnia, unspecified: Secondary | ICD-10-CM

## 2013-08-11 MED ORDER — ZOLPIDEM TARTRATE 5 MG PO TABS: ORAL_TABLET | ORAL | Status: AC

## 2013-08-11 NOTE — Telephone Encounter (Signed)
Caller name: [place]  Call back number:[phone] Pharmacy : [facility]  Reason for call:    Pt wants a refill on RX zolpidem (AMBIEN) 5 MG.  Last filled on 6/1.  Pt states that her script was only filled for 30 tablets and not the 90 tablets the script was written for.

## 2013-08-11 NOTE — Telephone Encounter (Signed)
Called [facility] and spoke with [employer], [place]. Caryl Pina stated that a prescription for Ambien was never received.  The prescription that the patient was speaking of below was from March.  [facility] [address] [facility] spoke with Verdis Frederickson, who also stated that they had not received the prescription as well.    Pt was seen in office on 6/1 for annual physical.  Dr. Etter Sjogren wrote under Assessment and Plan:  4. Insomnia  - zolpidem (AMBIEN) 5 MG tablet; TAKE 1 TAB AT BEDTIME AS NEEDED FOR SLEEP Dispense: 90 tablet; Refill: 0  Rx printed and placed in Dr. Nonda Lou red quick signature folder.  Awaiting signature.

## 2013-08-12 NOTE — Telephone Encounter (Signed)
Rx stamped and faxed      KP

## 2013-08-19 ENCOUNTER — Telehealth: Payer: Self-pay | Admitting: Family Medicine

## 2013-08-19 NOTE — Telephone Encounter (Signed)
Can you please find out what medication pt is talking about.

## 2013-08-19 NOTE — Telephone Encounter (Signed)
Dr. Virgil Benedict recommendations were discussed with patient.  Pt stated that she is unable to schedule an appointment at this time due to a lack of transportation (pt is unable to drive due to her eye sight).  States she will try to talk to her daughter and call back at a later time to schedule appointment.

## 2013-08-19 NOTE — Telephone Encounter (Signed)
Caller name: [place] Relation to RX:YVOPFYT Call back number: [phone] Pharmacy:  Reason for call: patient called and stated that she would like to start Lancets instead of metformin for her diabetes. Please advise.

## 2013-08-19 NOTE — Telephone Encounter (Signed)
Unable to reach patient by phone at this time.  Was unable to leave voice message due to mailbox being full. Will call back at a later time.

## 2013-08-19 NOTE — Telephone Encounter (Signed)
This is a discussion she will need to have w/ her PCP when she returns on Monday.  Lantus is a daily injxn and much different than oral Metformin.  I would recommend her scheduling an appt w/ Dr Etter Sjogren to review

## 2013-08-19 NOTE — Telephone Encounter (Signed)
Pt stated that she no longer wants to take Metformin due to the potential side effects.  She has not experienced any of the side effects as of yet, but would like to switch the Metformin to Lantus if possible.   Please advise.

## 2013-09-09 ENCOUNTER — Telehealth: Payer: Self-pay

## 2013-09-09 NOTE — Telephone Encounter (Signed)
Received fax on 09/08/13 from [facility] requesting to stop Plavix for 3 days.  Pt scheduled for eye surgery on 09/15/13.  Request labeled and placed in Dr. Nonda Lou red folder for review and signature.

## 2013-09-13 ENCOUNTER — Other Ambulatory Visit: Payer: Self-pay

## 2013-09-13 MED ORDER — SERTRALINE HCL 100 MG PO TABS: ORAL_TABLET | ORAL | Status: DC

## 2013-09-28 NOTE — Telephone Encounter (Signed)
Received signed form from Dr. Etter Sjogren.  Form faxed.//AB/CMA

## 2013-09-29 ENCOUNTER — Telehealth: Payer: Self-pay

## 2013-09-29 NOTE — Telephone Encounter (Signed)
Caller name:Suprina Relation to pt: Call back number:[phone] Pharmacy:  Reason for call: [place] called and said that she had been in the hospital in [place] for a couple of days from a fall, she stated that her head was swollen and she was still having dizzy spells. The doctor told her to follow up with PCP in few days she  Wanted to come 1st or 2nd week in September, I tried to book her an appointment in our hospital follow up slots 11:30, but she could not come then, she wanted first appointment in morning or last one of day. The first appointment I found was 10/14/13 at 8:15

## 2013-09-29 NOTE — Telephone Encounter (Addendum)
Admitted [facility]: 09/25/13 Discharged to home: 09/28/13  Hospital records---Care Everywhere  Pt is home.  Continues to experience intermittent dizzy spells ("head swimming") when changing positions (for lying to standing and standing to lying).  Spells last approximately 3-10 seconds and then go away.  She also has a "ball" in the back of her head.  Denies headache today, but states her head hurts when she turns her head from side to side or lean her head back.  No changes in vision since fall.  States she has been tired and sleepy since discharge.    Daughter has discharge paperwork. Encouraged to bring in with her during visit. Daughter--waiting daughter to make earlier appointment.  Calling back tomorrow.

## 2013-09-30 NOTE — Telephone Encounter (Signed)
Pt called back to make a sooner appointment.  New appointment scheduled for 10/05/13 @ 11:30 am.  Pt states that she's feeling much better today, but is still experiencing dizziness when changing positions.  She knows to rise slowly when changing positions.  She also ambulating with a walker.  She did express a concern of not having a BM since the day she fell (09/25/13).  Pt states she has not had much an appetite and is not consuming enough fluids.  Pt was advised to eat small meals that consists of fruits and vegetables and lean meats.  She was also encouraged to drink more fluids and to have her daughter pick up some OTC stool softener or Mirlax to aid patient in having a bowel movement.  Medication and allergies:  Reviewed.  Pt is unfamiliar with her medication names and stated that she would bring her meds in with her during the appointment to review.     [facility]  [id number] [place],  - [address], family history and past surgical hx: Reviewed and updated

## 2013-10-05 ENCOUNTER — Encounter: Payer: Self-pay | Admitting: Family Medicine

## 2013-10-05 ENCOUNTER — Ambulatory Visit (INDEPENDENT_AMBULATORY_CARE_PROVIDER_SITE_OTHER): Payer: Commercial Managed Care - HMO | Admitting: Family Medicine

## 2013-10-05 VITALS — BP 120/72 | HR 70 | Temp 98.0°F | Wt 164.0 lb

## 2013-10-05 DIAGNOSIS — E213 Hyperparathyroidism, unspecified: Secondary | ICD-10-CM

## 2013-10-05 DIAGNOSIS — R221 Localized swelling, mass and lump, neck: Secondary | ICD-10-CM

## 2013-10-05 DIAGNOSIS — R220 Localized swelling, mass and lump, head: Secondary | ICD-10-CM

## 2013-10-05 DIAGNOSIS — Z23 Encounter for immunization: Secondary | ICD-10-CM

## 2013-10-05 DIAGNOSIS — E559 Vitamin D deficiency, unspecified: Secondary | ICD-10-CM

## 2013-10-05 DIAGNOSIS — R55 Syncope and collapse: Secondary | ICD-10-CM

## 2013-10-05 DIAGNOSIS — E1159 Type 2 diabetes mellitus with other circulatory complications: Secondary | ICD-10-CM

## 2013-10-05 DIAGNOSIS — Z8673 Personal history of transient ischemic attack (TIA), and cerebral infarction without residual deficits: Secondary | ICD-10-CM | POA: Insufficient documentation

## 2013-10-05 DIAGNOSIS — E039 Hypothyroidism, unspecified: Secondary | ICD-10-CM | POA: Insufficient documentation

## 2013-10-05 MED ORDER — VITAMIN D (ERGOCALCIFEROL) 1.25 MG (50000 UNIT) PO CAPS: 50000.0000 [IU] | ORAL_CAPSULE | ORAL | Status: AC

## 2013-10-05 MED ORDER — INSULIN GLARGINE 100 UNIT/ML SOLOSTAR PEN: PEN_INJECTOR | SUBCUTANEOUS | Status: AC

## 2013-10-05 NOTE — Progress Notes (Signed)
Subjective:    Patient ID: [name], female    DOB: 01-03-1951, 63 y.o.   MRN: [id number]  HPI Pt here f/u hospital for syncope -- pt stood up and past out-- hit her head.  Pt was admitted to [facility].  She was orthostatic ---  Ca was high and pth was added before her d/c.  It came back high.  Pt is still c/o fatigue and dizziness. With change of position.    Review of Systems As above    Past Medical History  Diagnosis Date  . Diabetes mellitus   . Stroke   . Detached retina   . Blind right eye   . Kidney dysfunction   . Elevated cholesterol   . Fibroid     Fundal myoma  . Thyroid disease     Hypothyroid  . Hypertension   . Fall    History   Social History  . Marital Status: Widowed    Spouse Name: Donata Clay    Number of Children: 1  . Years of Education: 16   Occupational History  . Designer, multimedia     reitred on disability   Social History Main Topics  . Smoking status: Former Research scientist (life sciences)  . Smokeless tobacco: Never Used  . Alcohol Use: No  . Drug Use: No  . Sexual Activity: No   Other Topics Concern  . Not on file   Social History Narrative   HSG, [employer]. [place] -'[address]. married '74 widowed '14. 1 daughter -'33. work: Designer, multimedia until disabled. Unstable home situation since death of her husband Mar 04, 2022.   Current Outpatient Prescriptions  Medication Sig Dispense Refill  . amLODipine (NORVASC) 10 MG tablet TAKE 1 TABLET EVERY DAY  90 tablet  3  . aspirin 81 MG tablet Take 81 mg by mouth daily.        . clopidogrel (PLAVIX) 75 MG tablet TAKE 1 TABLET BY MOUTH EVERY DAY  90 tablet  3  . fludrocortisone (FLORINEF) 0.1 MG tablet Take 2 tablets (0.2 mg total) by mouth 2 (two) times daily.  387 tablet  0  . folic acid (FOLVITE) 1 MG tablet TAKE 1 TABLET BY MOUTH EVERY DAY  30 tablet  11  . levothyroxine (SYNTHROID, LEVOTHROID) 88 MCG tablet Take 1 tablet (88 mcg total) by mouth daily.  30 tablet  11  . paricalcitol  (ZEMPLAR) 1 MCG capsule Take 1 capsule (1 mcg total) by mouth daily.  30 capsule  11  . sertraline (ZOLOFT) 100 MG tablet TAKE 1 TABLET (100 MG TOTAL) BY MOUTH DAILY.  90 tablet  1  . simvastatin (ZOCOR) 20 MG tablet TAKE 1 TABLET BY MOUTH IN THE EVENING  90 tablet  3  . zolpidem (AMBIEN) 5 MG tablet TAKE 1 TAB AT BEDTIME AS NEEDED FOR SLEEP  90 tablet  0  . Insulin Glargine (LANTUS SOLOSTAR) 100 UNIT/ML Solostar Pen 10 u SQ qd  5 pen  PRN  . Vitamin D, Ergocalciferol, (DRISDOL) 50000 UNITS CAPS capsule Take 1 capsule (50,000 Units total) by mouth every 7 (seven) days.  4 capsule  5   No current facility-administered medications for this visit.    Objective:   Physical Exam BP 120/72  Pulse 70  Temp(Src) 98 F (36.7 C) (Oral)  Wt 164 lb (74.39 kg)  SpO2 98% General appearance: alert, cooperative, appears stated age and no distress Neck: thyroid: enlarged Lungs: clear to auscultation bilaterally Heart: S1, S2 normal  vit D  14.9     PTH 229-- from [facility] in Epic Assessment & Plan:  1. Hyperparathyroidism Refer to ENDO - US Soft Tissue Head/Neck; Future - Ambulatory referral to Endocrinology  2. Neck mass   - US Soft Tissue Head/Neck; Future  3. Unspecified vitamin D deficiency Secondary to hyperparathyroidis - Vitamin D, Ergocalciferol, (DRISDOL) 50000 UNITS CAPS capsule; Take 1 capsule (50,000 Units total) by mouth every 7 (seven) days.  Dispense: 4 capsule; Refill: 5  4. Type II or unspecified type diabetes mellitus with peripheral circulatory disorders, uncontrolled(250.72) Pt was on lantus in past at much higher dose--- pt daughter will send [place] glucose readings in about 2 weeks or sooner - Insulin Glargine (LANTUS SOLOSTAR) 100 UNIT/ML Solostar Pen; 10 u SQ qd  Dispense: 5 pen; Refill: PRN - Ambulatory referral to Endocrinology  5. Vasovagal syncope Pt is back on florinef con't for now  6 flu shot given

## 2013-10-05 NOTE — Progress Notes (Signed)
Pre visit review using our clinic review tool, if applicable. No additional management support is needed unless otherwise documented below in the visit note. 

## 2013-10-05 NOTE — Assessment & Plan Note (Signed)
Orthostatic Hospitalized 09/2013-- Osborne Oman

## 2013-10-05 NOTE — Patient Instructions (Addendum)
Parathyroid Hormone This is a test to determine whether PTH levels are responding normally to changes in blood calcium levels. It also helps to distinguish the cause of calcium imbalances, and to evaluate parathyroid function when calcium blood levels are higher or lower than normal, and when your caregiver may want to determine how well your parathyroid glands are working. Parathyroid hormone (PTH) helps the body maintain stable levels of calcium in the blood. It is part of a "feedback loop" that includes calcium, PTH, vitamin D, and, to some extent, phosphate and magnesium. Conditions and diseases that disrupt this feedback loop can cause inappropriate elevations or decreases in calcium and PTH levels and lead to symptoms of hypercalcemia (raised blood levels of calcium) or hypocalcemia (low blood levels of calcium).  PTH is produced by four parathyroid glands that are located in the neck beside the thyroid gland. Normally, these glands secrete PTH into the bloodstream in response to low blood calcium levels. Parathyroid hormone then works in three ways to help raise blood calcium levels back to normal. It takes calcium from the body's bone, stimulates the activation of vitamin D in the kidney (which in turn increases the absorption of calcium from the intestines), and suppresses the excretion of calcium in the urine (while encouraging excretion of phosphate). As calcium levels begin to increase in the blood, PTH normally decreases. PREPARATION FOR TEST You should have nothing to eat or drink except for water after midnight on the day of the test or as directed by your caregiver. A blood sample is obtained by inserting a needle into a vein in the arm. NORMAL FINDINGS Conventional Normal  PTH intact (whole).  Assay includes intact PTH.  Values (pg/mL) 10-65.  SI Units (ng/L) 10-65.  PTH N-terminal.  Values (pg/mL) 8-24.  SI Units (ng/L)8-24.  PTH C-terminal.  Assay includes  C-terminal.  Values (pg/mL) 50-330.  SI Units (ng/L) 50-330.  Intact PTH.  Midmolecule. Ranges for normal findings may vary among different laboratories and hospitals. You should always check with your doctor after having lab work or other tests done to discuss the meaning of your test results and whether your values are considered within normal limits. MEANING OF TEST  Your caregiver will go over the test results with you and discuss the importance and meaning of your results, as well as treatment options and the need for additional tests if necessary. OBTAINING THE TEST RESULTS  It is your responsibility to obtain your test results. Ask the lab or department performing the test when and how you will get your results. Document Released: 03/02/2004 Document Revised: 06/14/2013 Document Reviewed: 01/10/2008 [facility] Patient Information 2015 [place], [place]. This information is not intended to replace advice given to you by your health care provider. Make sure you discuss any questions you have with your health care provider.  Get vita D3 2000 u daily---OTC

## 2013-10-13 ENCOUNTER — Other Ambulatory Visit: Payer: Self-pay | Admitting: Family Medicine

## 2013-10-14 ENCOUNTER — Ambulatory Visit: Payer: Commercial Managed Care - HMO | Admitting: Family Medicine

## 2013-10-15 ENCOUNTER — Telehealth: Payer: Self-pay | Admitting: Family Medicine

## 2013-10-15 DIAGNOSIS — H5440 Blindness, one eye, unspecified eye: Secondary | ICD-10-CM

## 2013-10-15 DIAGNOSIS — H5710 Ocular pain, unspecified eye: Secondary | ICD-10-CM

## 2013-10-15 NOTE — Telephone Encounter (Signed)
Caller name: Marlowe Kays  Relation to pt: Ophthalmologists in [place], [place] Call back number: [phone] Reason for Call:    Dr. April Holding MD [facility] in [place], [place] NPI # [id number]  Dx Code: 369.60  Dx Code 379.91 Appointment Sept 16, 2015 outpatient surgery  [place]  Phone: [phone] Fax # [phone]

## 2013-10-15 NOTE — Telephone Encounter (Signed)
Ref placed.      KP 

## 2013-10-21 ENCOUNTER — Encounter: Payer: Self-pay | Admitting: Internal Medicine

## 2013-10-21 ENCOUNTER — Ambulatory Visit (INDEPENDENT_AMBULATORY_CARE_PROVIDER_SITE_OTHER): Payer: Commercial Managed Care - HMO | Admitting: Internal Medicine

## 2013-10-21 VITALS — BP 142/74 | HR 73 | Temp 98.3°F | Resp 12 | Ht 64.0 in | Wt 162.0 lb

## 2013-10-21 DIAGNOSIS — E119 Type 2 diabetes mellitus without complications: Secondary | ICD-10-CM

## 2013-10-21 NOTE — Progress Notes (Signed)
Patient ID: [name], female   DOB: 07-16-1950, 63 y.o.   MRN: [id number]  HPI: DAFFNEY GREENLY is a 63 y.o.-year-old female, referred by her PCP, Dr. Etter Sjogren, for management of DM2, non-insulin-dependent, uncontrolled, without complications. She was also referred for her hyperparathyroidism - however, this was not addressed, as it is secondary to her CKD and managed by Dr Moshe Cipro - she will have a new appt with her this month.  Patient has been diagnosed with GDM, then diabetes in 1982, then DM afterwards; she was on Lantus before (7 units in am), but stopped ~1 year ago 2/2 low CBGs. She has been Rx'ed Lantus again recently but did not start yet. Last hemoglobin A1c was: Lab Results  Component Value Date   HGBA1C 8.2* 07/12/2013   HGBA1C 6.8* 01/19/2013   HGBA1C 6.2 09/17/2012   Pt is on a regimen of: - Metformin XR in am  Pt was checking  her sugars 0-1 x a day (not lately).  No lows. Lowest sugar was 90; she has hypoglycemia awareness at 60.  Highest sugar was 200s.  Pt's meals are: - Brunch: yoghurt or cereal - Dinner: soup, sandwiches, canned veggies - Snacks: 1-2 (2 single mints after dinner), milkshake Dinks regular Coca Cola 1-2 16 oz bottles a day.  - + CKD (Dr Moshe Cipro - next appt 11/01/2013), last BUN/creatinine:  Lab Results  Component Value Date   BUN 35* 07/12/2013   CREATININE 3.1* 07/12/2013   - last set of lipids: Lab Results  Component Value Date   CHOL 191 07/12/2013   HDL 53.20 07/12/2013   LDLCALC 115* 07/12/2013   TRIG 116.0 07/12/2013   CHOLHDL 4 07/12/2013   - last eye exam was in 08/2013. 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  - no numbness and tingling in her feet.  Pt has FH of DM in mother, brother, sister, aunts, and uncles.  She also has hypothyroidism, On Levothyroxine. Last TSH: Lab Results  Component Value Date   TSH 1.85 07/12/2013   ROS: Constitutional: no weight gain/loss, + decreased appetite, no fatigue, + subjective hypothermia, +  poor sleep, + excessive urination and nocturia Eyes: no blurry vision, no xerophthalmia ENT: no sore throat, no nodules palpated in throat, no dysphagia/odynophagia, no hoarseness Cardiovascular: no CP/SOB/palpitations/leg swelling Respiratory: no cough/SOB Gastrointestinal: no N/V/D/+ C Musculoskeletal: no muscle/joint aches Skin: no rashes, + easy bruising Neurological: no tremors/numbness/tingling/dizziness Psychiatric: no depression/anxiety  Past Medical History  Diagnosis Date  . Diabetes mellitus   . Stroke   . Detached retina   . Blind right eye   . Kidney dysfunction   . Elevated cholesterol   . Fibroid     Fundal myoma  . Thyroid disease     Hypothyroid  . Hypertension   . Fall    Past Surgical History  Procedure Laterality Date  . Eye surgery    . Cesarean section    . Back surgery    . Appendectomy    . Cholecystectomy    . Carotid artery blockage    . Foot surgery     History   Social History  . Marital Status: Widowed    Spouse Name: Donata Clay    Number of Children: 1  . Years of Education: 16   Occupational History  . Designer, multimedia     reitred on disability   Social History Main Topics  . Smoking status: Former Research scientist (life sciences)  . Smokeless tobacco: Never Used  . Alcohol Use: No  .  Drug Use: No   Social History Narrative   HSG, [place]. [place] -'[address]. married '74 widowed '14. 1 daughter -'68. work: Designer, multimedia until disabled. Unstable home situation since death of her husband 2022-03-10.   Current Outpatient Prescriptions on File Prior to Visit  Medication Sig Dispense Refill  . amLODipine (NORVASC) 10 MG tablet TAKE 1 TABLET EVERY DAY  90 tablet  3  . aspirin 81 MG tablet Take 81 mg by mouth daily.        . clopidogrel (PLAVIX) 75 MG tablet TAKE 1 TABLET BY MOUTH EVERY DAY  90 tablet  3  . fludrocortisone (FLORINEF) 0.1 MG tablet Take 2 tablets (0.2 mg total) by mouth 2 (two) times daily.  226 tablet  0  . folic acid (FOLVITE) 1  MG tablet TAKE 1 TABLET BY MOUTH EVERY DAY  30 tablet  11  . Insulin Glargine (LANTUS SOLOSTAR) 100 UNIT/ML Solostar Pen 10 u SQ qd  5 pen  PRN  . levothyroxine (SYNTHROID, LEVOTHROID) 88 MCG tablet Take 1 tablet (88 mcg total) by mouth daily.  30 tablet  11  . metFORMIN (GLUCOPHAGE-XR) 500 MG 24 hr tablet TAKE 1 TABLET (500 MG TOTAL) BY MOUTH DAILY WITH BREAKFAST.  30 tablet  0  . paricalcitol (ZEMPLAR) 1 MCG capsule Take 1 capsule (1 mcg total) by mouth daily.  30 capsule  11  . sertraline (ZOLOFT) 100 MG tablet TAKE 1 TABLET (100 MG TOTAL) BY MOUTH DAILY.  90 tablet  1  . simvastatin (ZOCOR) 20 MG tablet TAKE 1 TABLET BY MOUTH IN THE EVENING  90 tablet  3  . Vitamin D, Ergocalciferol, (DRISDOL) 50000 UNITS CAPS capsule Take 1 capsule (50,000 Units total) by mouth every 7 (seven) days.  4 capsule  5  . zolpidem (AMBIEN) 5 MG tablet TAKE 1 TAB AT BEDTIME AS NEEDED FOR SLEEP  90 tablet  0   No current facility-administered medications on file prior to visit.   Allergies  Allergen Reactions  . Codeine Nausea Only  . Morphine And Related     "makes her crazy in the head"   Family History  Problem Relation Age of Onset  . Hypertension Father   . Diabetes Sister   . Heart disease Sister   . Diabetes Brother   . Hypertension Brother   . Heart disease Brother   . Cancer Mother     Lung cancer  . Breast cancer Mother     Age 60  . Diabetes Paternal Uncle    PE: BP 142/74  Pulse 73  Temp(Src) 98.3 F (36.8 C) (Oral)  Resp 12  Ht 5\' 4"  (1.626 m)  Wt 162 lb (73.483 kg)  BMI 27.79 kg/m2  SpO2 99% Wt Readings from Last 3 Encounters:  10/21/13 162 lb (73.483 kg)  10/05/13 164 lb (74.39 kg)  07/12/13 168 lb 6.4 oz (76.386 kg)   Constitutional: overweight, in NAD Eyes: R pupil nonreactive, EOMI, no exophthalmos ENT: moist mucous membranes, no thyromegaly, no cervical lymphadenopathy Cardiovascular: RRR, No MRG Respiratory: CTA B Gastrointestinal: abdomen soft, NT, ND,  BS+ Musculoskeletal: no deformities, strength intact in all 4 Skin: moist, warm, no rashes Neurological: no tremor with outstretched hands, DTR normal in all 4  ASSESSMENT: 1. DM2, non-insulin-dependent, uncontrolled, without complications  PLAN:  1. Patient with long-standing, recently more uncontrolled diabetes, on oral antidiabetic regimen, which became insufficient. She cannot continue to take Metformin 2/2 her CKD. - We discussed about options  for treatment, and I suggested to:  Patient Instructions  Please stop Metformin. Start Lantus 10 units at bedtime. Call me if sugars consistently <80 or >200. Please return in 1 month with your sugar log.  - Strongly advised her to start checking sugars at different times of the day - check 2 times a day, rotating checks - given sugar log and advised how to fill it and to bring it at next appt  - given foot care handout and explained the principles  - given instructions for hypoglycemia management "15-15 rule"  - advised for yearly eye exams >> she is Up to date - will check a HbA1c today - Return to clinic in 1 mo with sugar log   Office Visit on 10/21/2013  Component Date Value Ref Range Status  . Hemoglobin A1C 10/21/2013 6.5  4.6 - 6.5 % Final   Glycemic Control Guidelines for People with Diabetes:Non Diabetic:  <6%Goal of Therapy: <7%Additional Action Suggested:  >8%    Excellent A1c!

## 2013-10-21 NOTE — Patient Instructions (Signed)
Please stop Metformin. Start Lantus 10 units at bedtime. Call me if sugars consistently <80 or >200. Please return in 1 month with your sugar log.   PATIENT INSTRUCTIONS FOR TYPE 2 DIABETES:  **Please join MyChart!** - see attached instructions about how to join if you have not done so already.  DIET AND EXERCISE Diet and exercise is an important part of diabetic treatment.  We recommended aerobic exercise in the form of brisk walking (working between 40-60% of maximal aerobic capacity, similar to brisk walking) for 150 minutes per week (such as 30 minutes five days per week) along with 3 times per week performing 'resistance' training (using various gauge rubber tubes with handles) 5-10 exercises involving the major muscle groups (upper body, lower body and core) performing 10-15 repetitions (or near fatigue) each exercise. Start at half the above goal but build slowly to reach the above goals. If limited by weight, joint pain, or disability, we recommend daily walking in a swimming pool with water up to waist to reduce pressure from joints while allow for adequate exercise.    BLOOD GLUCOSES Monitoring your blood glucoses is important for continued management of your diabetes. Please check your blood glucoses 2-4 times a day: fasting, before meals and at bedtime (you can rotate these measurements - e.g. one day check before the 3 meals, the next day check before 2 of the meals and before bedtime, etc.).   HYPOGLYCEMIA (low blood sugar) Hypoglycemia is usually a reaction to not eating, exercising, or taking too much insulin/ other diabetes drugs.  Symptoms include tremors, sweating, hunger, confusion, headache, etc. Treat IMMEDIATELY with 15 grams of Carbs:   4 glucose tablets    cup regular juice/soda   2 tablespoons raisins   4 teaspoons sugar   1 tablespoon honey Recheck blood glucose in 15 mins and repeat above if still symptomatic/blood glucose <100.  RECOMMENDATIONS TO REDUCE YOUR  RISK OF DIABETIC COMPLICATIONS: * Take your prescribed MEDICATION(S) * Follow a DIABETIC diet: Complex carbs, fiber rich foods, (monounsaturated and polyunsaturated) fats * AVOID saturated/trans fats, high fat foods, >2,300 mg salt per day. * EXERCISE at least 5 times a week for 30 minutes or preferably daily.  * DO NOT SMOKE OR DRINK more than 1 drink a day. * Check your FEET every day. Do not wear tightfitting shoes. Contact us if you develop an ulcer * See your EYE doctor once a year or more if needed * Get a FLU shot once a year * Get a PNEUMONIA vaccine once before and once after age 66 years  GOALS:  * Your Hemoglobin A1c of <7%  * fasting sugars need to be <130 * after meals sugars need to be <180 (2h after you start eating) * Your Systolic BP should be 387 or lower  * Your Diastolic BP should be 80 or lower  * Your HDL (Good Cholesterol) should be 40 or higher  * Your LDL (Bad Cholesterol) should be 100 or lower. * Your Triglycerides should be 150 or lower  * Your Urine microalbumin (kidney function) should be <30 * Your Body Mass Index should be 25 or lower    Please consider the following ways to cut down carbs and fat and increase fiber and micronutrients in your diet: - substitute whole grain for white bread or pasta - substitute brown rice for white rice - substitute 90-calorie flat bread pieces for slices of bread when possible - substitute sweet potatoes or yams for white potatoes -  substitute humus for margarine - substitute tofu for cheese when possible - substitute almond or rice milk for regular milk (would not drink soy milk daily due to concern for soy estrogen influence on breast cancer risk) - substitute dark chocolate for other sweets when possible - substitute water - can add lemon or orange slices for taste - for diet sodas (artificial sweeteners will trick your body that you can eat sweets without getting calories and will lead you to overeating and weight  gain in the long run) - do not skip breakfast or other meals (this will slow down the metabolism and will result in more weight gain over time)  - can try smoothies made from fruit and almond/rice milk in am instead of regular breakfast - can also try old-fashioned (not instant) oatmeal made with almond/rice milk in am - order the dressing on the side when eating salad at a restaurant (pour less than half of the dressing on the salad) - eat as little meat as possible - can try juicing, but should not forget that juicing will get rid of the fiber, so would alternate with eating raw veg./fruits or drinking smoothies - use as little oil as possible, even when using olive oil - can dress a salad with a mix of balsamic vinegar and lemon juice, for e.g. - use agave nectar, stevia sugar, or regular sugar rather than artificial sweateners - steam or broil/roast veggies  - snack on veggies/fruit/nuts (unsalted, preferably) when possible, rather than processed foods - reduce or eliminate aspartame in diet (it is in diet sodas, chewing gum, etc) Read the labels!  Try to read Dr. Janene Harvey book: "Program for Reversing Diabetes" for other ideas for healthy eating.

## 2013-10-22 ENCOUNTER — Telehealth: Payer: Self-pay | Admitting: Internal Medicine

## 2013-10-22 LAB — HEMOGLOBIN A1C: Hgb A1c MFr Bld: 6.5 % (ref 4.6–6.5)

## 2013-11-12 ENCOUNTER — Ambulatory Visit: Payer: Commercial Managed Care - HMO | Admitting: Medical

## 2013-11-18 ENCOUNTER — Telehealth: Payer: Self-pay | Admitting: Family Medicine

## 2013-11-18 NOTE — Telephone Encounter (Signed)
OK to refill Sertraline 100 mg po daily disp #30 with 1 rf

## 2013-11-18 NOTE — Telephone Encounter (Signed)
Caller name:[facility]  Relation to pt: pharmacy   Call back number:[phone]    Reason for call: pt requesting a refill sertraline (ZOLOFT) 100 MG tablet

## 2013-11-19 MED ORDER — SERTRALINE HCL 100 MG PO TABS: ORAL_TABLET | ORAL | Status: AC

## 2013-12-01 ENCOUNTER — Telehealth: Payer: Self-pay | Admitting: Internal Medicine

## 2013-12-01 ENCOUNTER — Ambulatory Visit: Payer: Commercial Managed Care - HMO | Admitting: Internal Medicine

## 2013-12-01 DIAGNOSIS — Z0289 Encounter for other administrative examinations: Secondary | ICD-10-CM

## 2013-12-01 NOTE — Telephone Encounter (Signed)
Patient no showed today's appt. Please advise on how to follow up. °A. No follow up necessary. °B. Follow up urgent. Contact patient immediately. °C. Follow up necessary. Contact patient and schedule visit in ___ days. °D. Follow up advised. Contact patient and schedule visit in ____weeks. ° °

## 2013-12-01 NOTE — Telephone Encounter (Signed)
1 mo 

## 2013-12-12 DEATH — deceased

## 2013-12-13 ENCOUNTER — Encounter: Payer: Self-pay | Admitting: Internal Medicine

## 2013-12-15 ENCOUNTER — Telehealth: Payer: Self-pay | Admitting: *Deleted

## 2013-12-15 NOTE — Telephone Encounter (Signed)
Left msg on triage stating been trying to get refill on pt furosemide. Have fax office twice with no response..../lmb  Pt see Dr. Etter Sjogren will forward msg to Dr. Etter Sjogren...[name]

## 2013-12-15 NOTE — Telephone Encounter (Signed)
Furosemide is not on the medication list. Please advise if refill is appropriate.     KP

## 2013-12-16 NOTE — Telephone Encounter (Signed)
Tried calling the patient but her number is not a working number. Denial sent to the pharmacy.     KP

## 2013-12-16 NOTE — Telephone Encounter (Signed)
Who was giving it to her-- she never told us she was on it-- not on med list

## 2013-12-16 NOTE — Telephone Encounter (Signed)
Dr.Norins prescribed it it he past, however it was removed from the medication list.

## 2013-12-16 NOTE — Telephone Encounter (Signed)
i believe it was stopped due to worsening kidney function and low bp----it was stopped in June

## 2014-01-17 ENCOUNTER — Encounter: Payer: Commercial Managed Care - HMO | Admitting: Family Medicine

## 2014-01-17 ENCOUNTER — Telehealth: Payer: Self-pay | Admitting: *Deleted

## 2014-01-17 NOTE — Telephone Encounter (Signed)
Pt did not show for appointment 01/17/2014 at 8:30am for CPE

## 2014-01-17 NOTE — Telephone Encounter (Signed)
Please call pt --- many never received our mailing and if she did not get a phone call --- do not charge

## 2014-01-24 ENCOUNTER — Other Ambulatory Visit: Payer: Self-pay

## 2014-01-24 DIAGNOSIS — E079 Disorder of thyroid, unspecified: Secondary | ICD-10-CM

## 2014-01-24 MED ORDER — LEVOTHYROXINE SODIUM 88 MCG PO TABS: 88.0000 ug | ORAL_TABLET | Freq: Every day | ORAL | Status: AC

## 2014-01-24 MED ORDER — AMLODIPINE BESYLATE 10 MG PO TABS: 10.0000 mg | ORAL_TABLET | Freq: Every day | ORAL | Status: AC

## 2014-02-09 NOTE — Telephone Encounter (Signed)
error 

## 2014-03-16 ENCOUNTER — Telehealth: Payer: Self-pay | Admitting: *Deleted

## 2014-03-16 MED ORDER — SIMVASTATIN 20 MG PO TABS: 20.0000 mg | ORAL_TABLET | Freq: Every evening | ORAL | Status: AC

## 2014-03-16 NOTE — Telephone Encounter (Signed)
Letter mailed to schedule OV

## 2014-03-16 NOTE — Telephone Encounter (Signed)
Left msg on triage requesting refill on pt simvastatin 20 mg. Pt see Dr. Etter Sjogren now forwarding to [facility] [place]...[name]

## 2015-01-05 LAB — COLOGUARD
